# Patient Record
Sex: Male | Born: 1965 | Race: White | Hispanic: No | Marital: Married | Smoking: Never smoker
Health system: Southern US, Community
[De-identification: ages and names within clinical notes are randomized; demographics above are authoritative.]

## PROBLEM LIST (undated history)

## (undated) DIAGNOSIS — E785 Hyperlipidemia, unspecified: Secondary | ICD-10-CM

## (undated) DIAGNOSIS — I10 Essential (primary) hypertension: Secondary | ICD-10-CM

## (undated) DIAGNOSIS — K219 Gastro-esophageal reflux disease without esophagitis: Secondary | ICD-10-CM

## (undated) HISTORY — DX: Hyperlipidemia, unspecified: E78.5

## (undated) HISTORY — PX: NERVE REPAIR: SHX2083

---

## 2000-04-27 ENCOUNTER — Ambulatory Visit (HOSPITAL_BASED_OUTPATIENT_CLINIC_OR_DEPARTMENT_OTHER): Admission: RE | Admit: 2000-04-27 | Discharge: 2000-04-27 | Payer: Self-pay | Admitting: Orthopedic Surgery

## 2004-05-21 ENCOUNTER — Ambulatory Visit (HOSPITAL_COMMUNITY): Admission: RE | Admit: 2004-05-21 | Discharge: 2004-05-21 | Payer: Self-pay | Admitting: Family Medicine

## 2004-08-25 ENCOUNTER — Ambulatory Visit: Payer: Self-pay | Admitting: Orthopedic Surgery

## 2006-05-08 ENCOUNTER — Ambulatory Visit: Payer: Self-pay | Admitting: Internal Medicine

## 2006-05-08 ENCOUNTER — Encounter (INDEPENDENT_AMBULATORY_CARE_PROVIDER_SITE_OTHER): Payer: Self-pay | Admitting: *Deleted

## 2006-09-05 ENCOUNTER — Encounter: Payer: Self-pay | Admitting: Internal Medicine

## 2006-09-05 DIAGNOSIS — M545 Low back pain, unspecified: Secondary | ICD-10-CM | POA: Insufficient documentation

## 2006-09-05 DIAGNOSIS — J309 Allergic rhinitis, unspecified: Secondary | ICD-10-CM | POA: Insufficient documentation

## 2006-09-15 ENCOUNTER — Ambulatory Visit: Payer: Self-pay | Admitting: Internal Medicine

## 2007-04-20 ENCOUNTER — Ambulatory Visit (HOSPITAL_COMMUNITY): Admission: RE | Admit: 2007-04-20 | Discharge: 2007-04-20 | Payer: Self-pay | Admitting: Family Medicine

## 2011-02-18 NOTE — Op Note (Signed)
[facility]  Patient:    Martin Young, Martin Young                         MRN: [id number] Proc. Date: 04/27/00 Adm. Date:  [id number] Attending:  Ronne Binning CC:         Nicki Reaper, M.D. x 2                           Operative Report  PREOPERATIVE DIAGNOSIS:  Laceration radial nerve left arm.  POSTOPERATIVE DIAGNOSIS:  Laceration radial nerve left arm.  OPERATION:  Exploration and repair of partial laceration radial nerve left forearm.  SURGEON:  Nicki Reaper, M.D.  ASSISTANT:  Joaquin Courts, R.N.  ANESTHESIA:  Axillary block.  ANESTHESIOLOGIST:  Cliffton Asters. Ivin Booty, M.D.  HISTORY:  The patient is a 45 year old male, who suffered a laceration of his left forearm.  He complains of numbness and tingling in radial nerve distribution.  PROCEDURE:  The patient was brought to the operating room, where a an axillary block was carried out without difficulty.  He was prepped and draped using Betadine scrub and solution with the left arm free.  The limb was exsanguinated with an Esmarch bandage, tourniquet placed high on the arm was inflated to 250 mmHg.  The wound was opened, extended proximally and distally. The laceration to the sensory nerve was identified.  The dissection was carried down to the base of the laceration.  The radial nerve was found at that level with a laceration in the proper nerve as the branch came off.  The ends of the nerve were repaired back to normal fascicles.  A repair was then performed after bringing the operating microscope into position due to the size of the nerve, where repair was performed using 9-0 nylon sutures in an epineural repair.  Mild tension was present.  The nerve was free proximally and distally.  Radial deviation and dorsiflexion of the wrist helped relieve tension.  The wound was irrigated.  Skin was closed with interrupted 5-0 nylon suture.  A sterile compressive dressing and splint was applied.  The  patient tolerated the procedure well and was taken to the recovery room for observation in satisfactory condition.  He was discharged home to return to the [facility] [place] in one week on Vicodin and Keflex. DD:  04/27/00 TD:  04/28/00 Job: [id number]

## 2011-07-13 ENCOUNTER — Other Ambulatory Visit (HOSPITAL_COMMUNITY): Payer: Self-pay | Admitting: Family Medicine

## 2011-07-13 DIAGNOSIS — R404 Transient alteration of awareness: Secondary | ICD-10-CM

## 2011-07-14 ENCOUNTER — Ambulatory Visit (HOSPITAL_COMMUNITY)
Admission: RE | Admit: 2011-07-14 | Discharge: 2011-07-14 | Disposition: A | Discharge status: Home / Self Care | Payer: BC Managed Care – PPO | Source: Ambulatory Visit | Attending: Family Medicine | Admitting: Family Medicine

## 2011-07-14 DIAGNOSIS — R55 Syncope and collapse: Secondary | ICD-10-CM | POA: Insufficient documentation

## 2011-07-14 DIAGNOSIS — R42 Dizziness and giddiness: Secondary | ICD-10-CM | POA: Insufficient documentation

## 2011-07-14 DIAGNOSIS — R4182 Altered mental status, unspecified: Secondary | ICD-10-CM | POA: Insufficient documentation

## 2011-07-14 DIAGNOSIS — R404 Transient alteration of awareness: Secondary | ICD-10-CM

## 2011-07-19 ENCOUNTER — Other Ambulatory Visit (HOSPITAL_COMMUNITY): Payer: Self-pay

## 2011-07-20 ENCOUNTER — Ambulatory Visit (HOSPITAL_COMMUNITY): Payer: BC Managed Care – PPO

## 2011-07-27 ENCOUNTER — Other Ambulatory Visit (HOSPITAL_COMMUNITY): Payer: BC Managed Care – PPO

## 2011-07-27 ENCOUNTER — Ambulatory Visit (HOSPITAL_COMMUNITY)
Admission: RE | Admit: 2011-07-27 | Discharge: 2011-07-27 | Disposition: A | Discharge status: Home / Self Care | Payer: BC Managed Care – PPO | Source: Ambulatory Visit | Attending: Family Medicine | Admitting: Family Medicine

## 2011-07-27 DIAGNOSIS — Z1389 Encounter for screening for other disorder: Secondary | ICD-10-CM | POA: Insufficient documentation

## 2011-07-27 DIAGNOSIS — R404 Transient alteration of awareness: Secondary | ICD-10-CM | POA: Insufficient documentation

## 2011-07-29 NOTE — Procedures (Signed)
NAMEAIVEN, KAMPE                ACCOUNT NO.:  [id number]  MEDICAL RECORD NO.:  [id number]  LOCATION:  EE                           FACILITY:  [facility]  PHYSICIAN:  Glenice Ciccone A. Gerilyn Pilgrim, M.D. DATE OF BIRTH:  09-16-1966  DATE OF PROCEDURE:  07/27/2011 DATE OF DISCHARGE:  07/27/2011                             EEG INTERPRETATION   REFERRING PHYSICIAN:  Annia Friendly. Hill, MD.  INDICATION:  A 45 year old, who presents with episode of altered mentation and loss of consciousness.  He has been found in the yard, laying down unresponsive with amnesia.  This study is being done to evaluate for seizures.  MEDICATION: NONE  ANALYSIS:  A 16-channel recording is conducted for 20 minutes.  There is a well-formed posterior dominant rhythm of 11 Hz, which attenuates with eye opening.  There is beta activity observed in frontal areas.  Awake and drowsy activities are recorded.  Photic stimulation and hyperventilation are carried out without abnormal changes in the background activity.  There is no focal or lateralized slowing.  There is no epileptiform activity.  IMPRESSION:  Normal recording of awake and drowsy states.  A single recording does not rule out epileptic seizures.  If clinically indicated, a sleep-deprived or a prolonged recording should be useful.     Matilde Pottenger A. Gerilyn Pilgrim, M.D.     KAD/MEDQ  D:  07/28/2011  T:  07/28/2011  Job:  [id number]

## 2012-03-21 IMAGING — CT CT HEAD W/O CM
1 series · 16 of 30 positions shown, 20 images · non-contrast
Comparison: Cervical MRI 05/21/2004.

CLINICAL DATA: 45-year-old male with syncope, dizziness, altered
level of consciousness.

CT HEAD WITHOUT CONTRAST
TECHNIQUE: Contiguous axial images were obtained from the base of
the skull through the vertex without contrast.

[Series 2: headseq 4.8 h37s · axial · 0.43mm/px · z∈[+136,+291]mm · 16 of 36 slices shown, 20 images]
[im 2/36  brain]
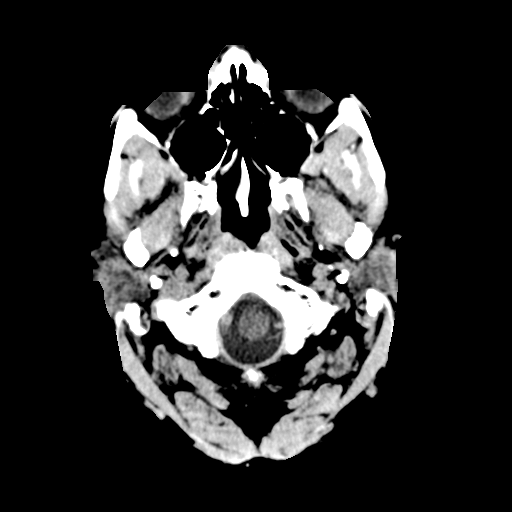
[im 2/36  bone]
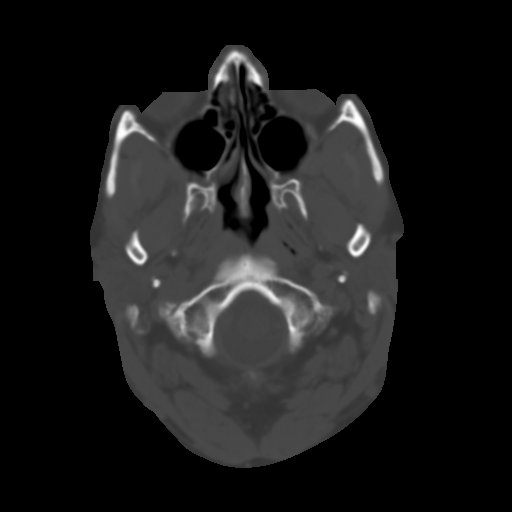
[im 4/36  brain]
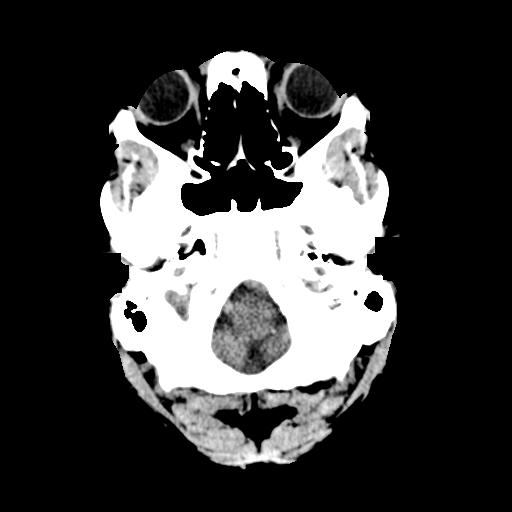
[im 7/36  brain]
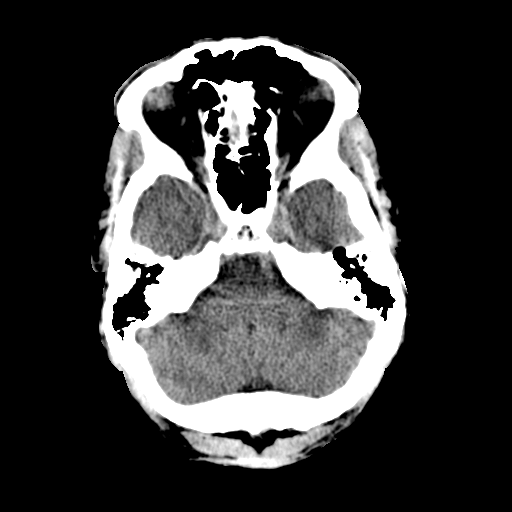
[im 9/36  brain]
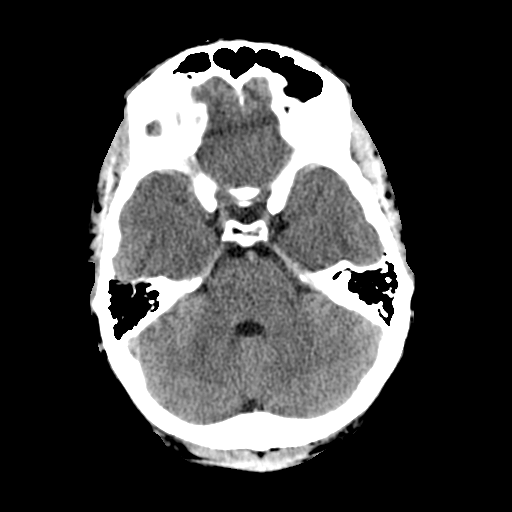
[im 10/36  brain]
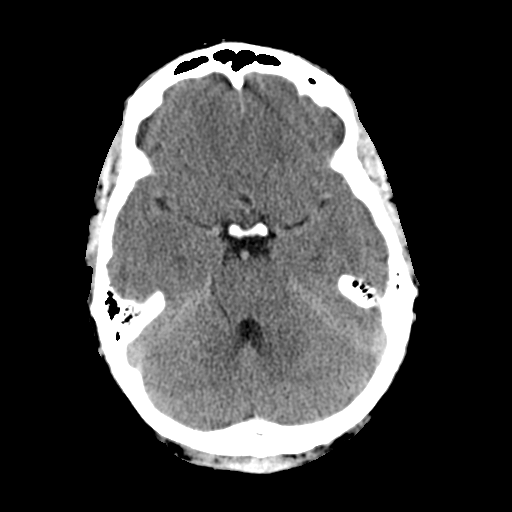
[im 10/36  bone]
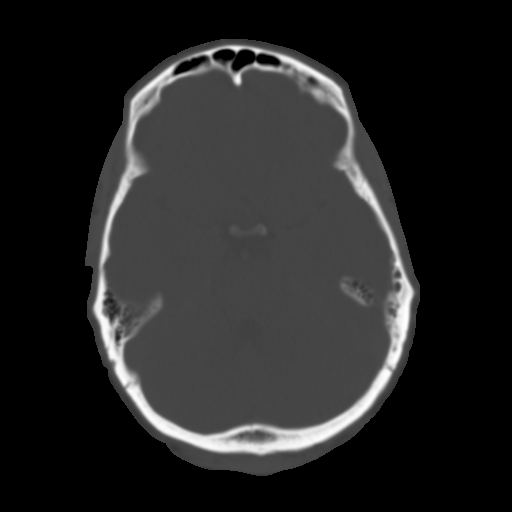
[im 13/36  brain]
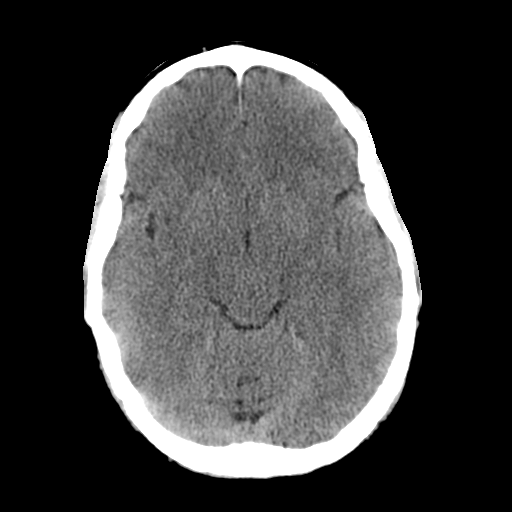
[im 15/36  brain]
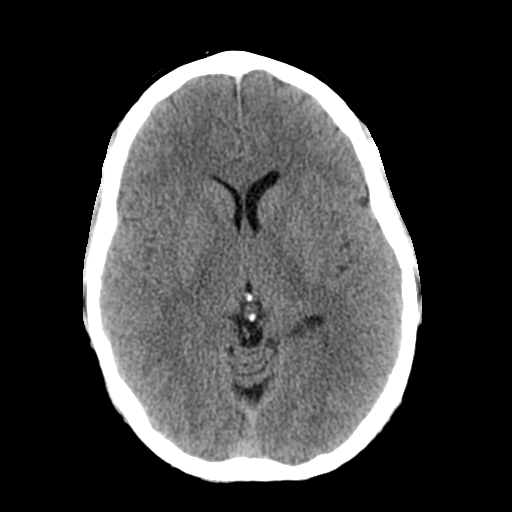
[im 17/36  brain]
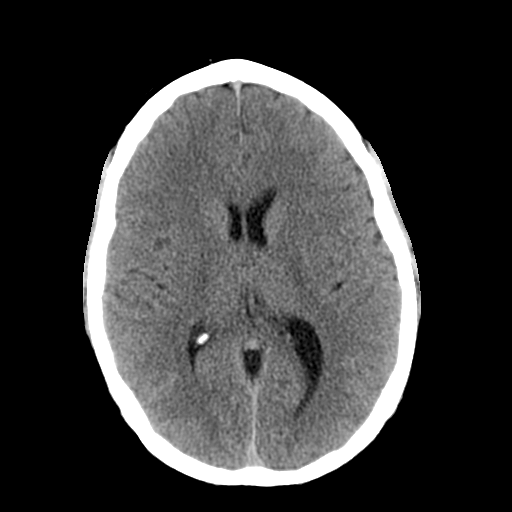
[im 19/36  brain]
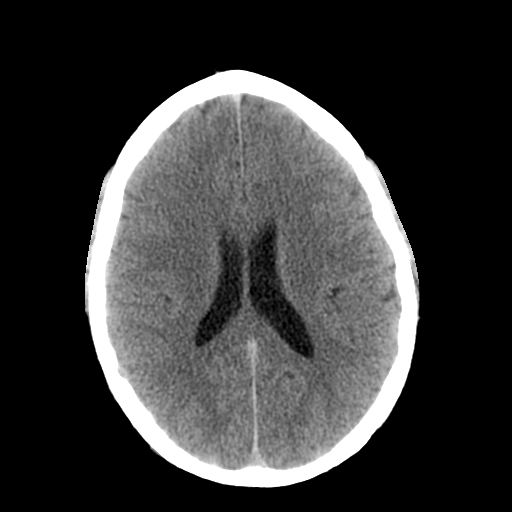
[im 19/36  bone]
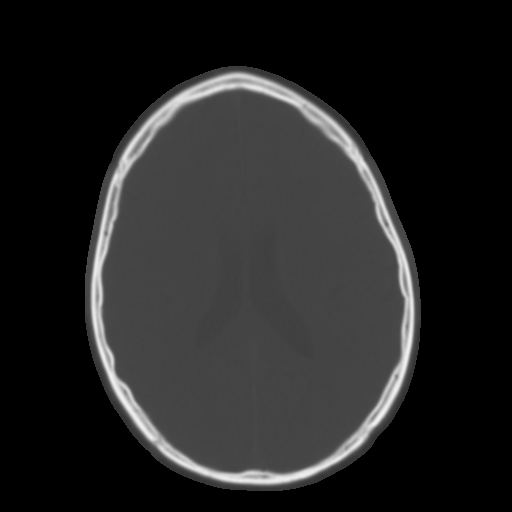
[im 21/36  brain]
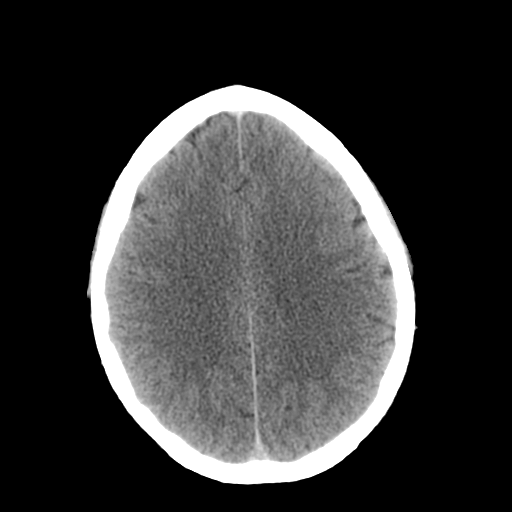
[im 23/36  brain]
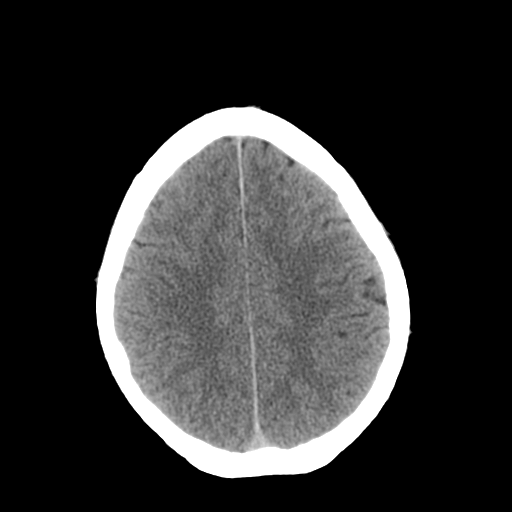
[im 26/36  brain]
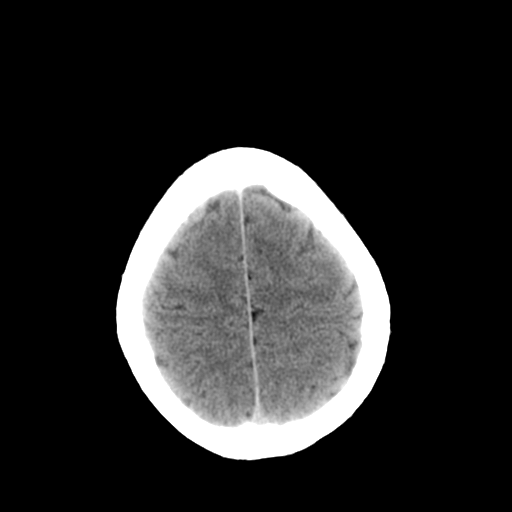
[im 27/36  brain]
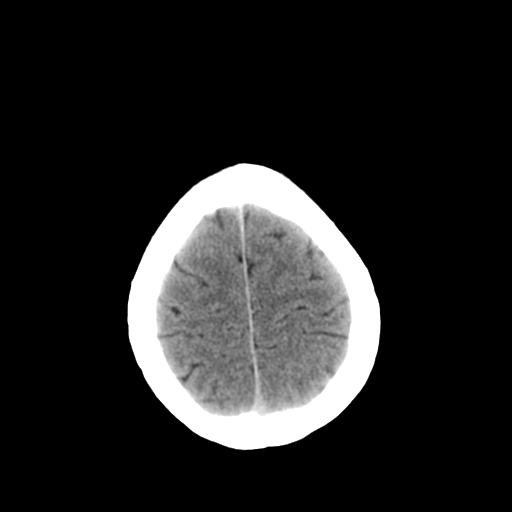
[im 27/36  bone]
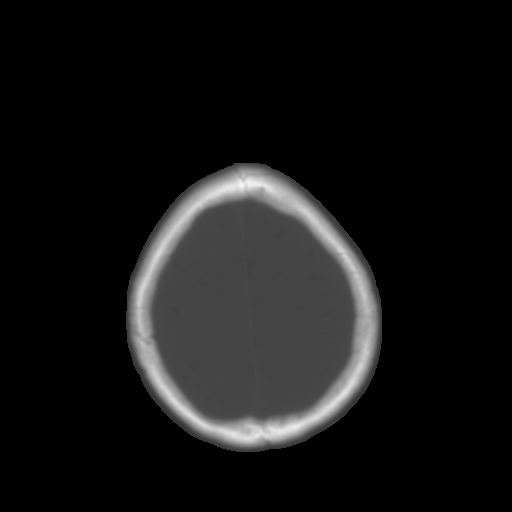
[im 29/36  brain]
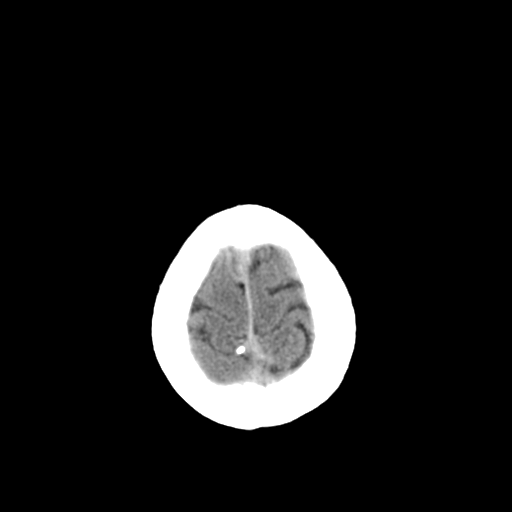
[im 32/36  brain]
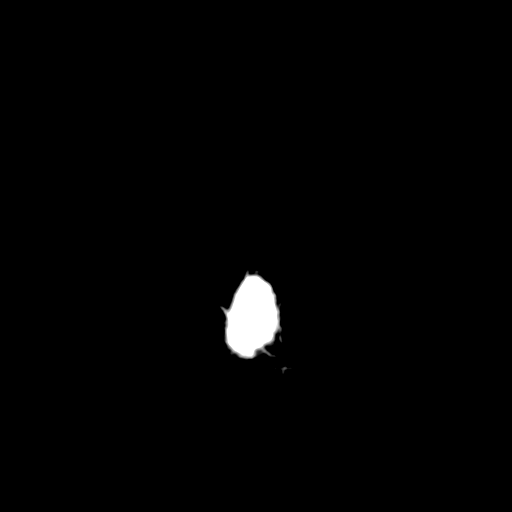
[im 34/36  brain]
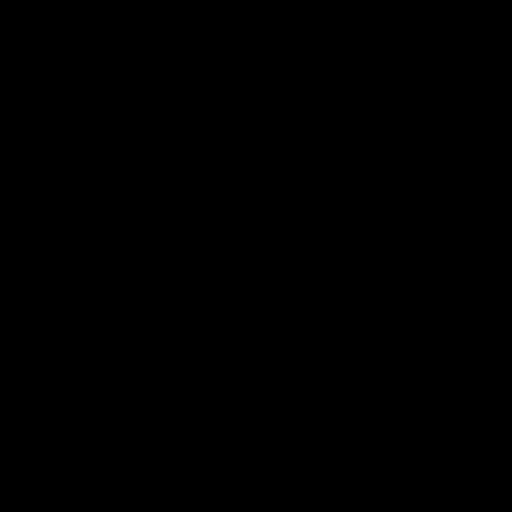

[16 of 30 positions shown; findings below may reference images not displayed]

FINDINGS: Visualized orbits and scalp soft tissues are within
normal limits.  Visualized paranasal sinuses and mastoids are
clear.  No acute osseous abnormality identified.

Cerebral volume is within normal limits for age.  No midline shift,
ventriculomegaly, mass effect, evidence of mass lesion,
intracranial hemorrhage or evidence of cortically based acute
infarction.  Gray-white matter differentiation is within normal
limits throughout the brain.  No suspicious intracranial vascular
hyperdensity.
IMPRESSION: Normal noncontrast CT appearance of the brain.

## 2013-12-17 ENCOUNTER — Other Ambulatory Visit (HOSPITAL_COMMUNITY): Payer: Self-pay | Admitting: Family Medicine

## 2013-12-17 ENCOUNTER — Ambulatory Visit (HOSPITAL_COMMUNITY)
Admission: RE | Admit: 2013-12-17 | Discharge: 2013-12-17 | Disposition: A | Discharge status: Home / Self Care | Payer: Managed Care, Other (non HMO) | Source: Ambulatory Visit | Attending: Family Medicine | Admitting: Family Medicine

## 2013-12-17 DIAGNOSIS — M79609 Pain in unspecified limb: Secondary | ICD-10-CM | POA: Insufficient documentation

## 2013-12-17 DIAGNOSIS — W2209XA Striking against other stationary object, initial encounter: Secondary | ICD-10-CM | POA: Insufficient documentation

## 2013-12-17 DIAGNOSIS — M79642 Pain in left hand: Secondary | ICD-10-CM

## 2015-06-30 ENCOUNTER — Ambulatory Visit (INDEPENDENT_AMBULATORY_CARE_PROVIDER_SITE_OTHER): Payer: Managed Care, Other (non HMO) | Admitting: Orthopedic Surgery

## 2015-06-30 ENCOUNTER — Ambulatory Visit (INDEPENDENT_AMBULATORY_CARE_PROVIDER_SITE_OTHER): Payer: Managed Care, Other (non HMO)

## 2015-06-30 VITALS — BP 118/77 | Ht 72.0 in | Wt 200.0 lb

## 2015-06-30 DIAGNOSIS — M7021 Olecranon bursitis, right elbow: Secondary | ICD-10-CM | POA: Diagnosis not present

## 2015-06-30 DIAGNOSIS — M25521 Pain in right elbow: Secondary | ICD-10-CM

## 2015-06-30 NOTE — Progress Notes (Signed)
Patient ID: Martin Young, male   DOB: [birth year], 49 y.o.   MRN: [id number]  New patient   Chief Complaint  Patient presents with  . Elbow Pain    Right elbow pain, injury back in July.     [name] is a 49 y.o. male.   HPI 49 year old male hit his elbow in July. Since that time he said pain swelling over the right olecranon bursa. Pain is dull it's 1 out of 10 does not affect range of motion. No neurovascular deficits no redness to the area he does have some seasonal allergies Review of Systems See hpi  Hypertension medical history on file.  No past surgical history .    Social History Social History  Substance Use Topics  . Smoking status: Not on file  . Smokeless tobacco: Not on file  . Alcohol Use: Not on file    No Known Allergies  No current outpatient prescriptions on file.   No current facility-administered medications for this visit.       Physical Exam Blood pressure 118/77, height 6' (1.829 m), weight 200 lb (90.719 kg). Physical Exam The patient is well developed well nourished and well groomed. Orientation to person place and time is normal  Mood is pleasant. Ambulatory status is normal without a limp Skin remains intact without laceration ulceration or erythema Gross motor exam is intact without atrophy. Muscle tone normal grade 5 motor strength Neurovascular exam remains intact Inspection right elbow large swelling no erythema or range of motion of the joint which is stable. Looks and feels like olecranon bursa.    Data Reviewed  I interpreted the images as  Soft tissue swelling and olecranon spur consistent with olecranon bursitis  Assessment   Encounter Diagnosis  Name Primary?  . Elbow pain, right Yes  Right olecranon bursitis  Plan   Aspiration right elbow  Aspiration right elbow. Verbal consent. Timeout confirm site. Alcohol. Ethyl chloride. 18-gauge needle 10 mL syringe aspirated 15 mL of bloody fluid consistent with  posttraumatic olecranon bursitis  Ace wrap no complications  Follow-up as needed

## 2015-07-02 ENCOUNTER — Telehealth: Payer: Self-pay | Admitting: Orthopedic Surgery

## 2015-07-02 NOTE — Telephone Encounter (Signed)
Call received from patient regarding right elbow problem (olecranon bursitis) which was treated 06/30/15  - said that the problem is recurring, and is "about the same as it was then" - swelling, pain again.  Asking if needs another appointment or if other recommendation:  Please advise.

## 2015-07-02 NOTE — Telephone Encounter (Signed)
Re aspiration Monday afternoon 130

## 2015-07-06 ENCOUNTER — Encounter: Payer: Self-pay | Admitting: Orthopedic Surgery

## 2015-07-06 ENCOUNTER — Ambulatory Visit (INDEPENDENT_AMBULATORY_CARE_PROVIDER_SITE_OTHER): Payer: Managed Care, Other (non HMO) | Admitting: Orthopedic Surgery

## 2015-07-06 VITALS — BP 130/83 | Ht 72.0 in | Wt 200.0 lb

## 2015-07-06 DIAGNOSIS — M7021 Olecranon bursitis, right elbow: Secondary | ICD-10-CM

## 2015-07-06 NOTE — Progress Notes (Signed)
Patient ID: Martin Young, male   DOB: [birth year], 49 y.o.   MRN: [id number]  Follow up visit  Chief Complaint  Patient presents with  . Follow-up    FOLLOW UP RIGHT ELBOW    BP 130/83 mmHg  Ht 6' (1.829 m)  Wt 200 lb (90.719 kg)  BMI 27.12 kg/m2  No diagnosis found.   status post aspiration and drainage for olecranon bursitis. The fluid came back almost immediately. We therefore reaspirated today and I recommend that he have the bursa removed. He also has a spur on the olecranon which I would like to take off as well. We will arrange this at his convenience.  Review of Systems  Endo/Heme/Allergies: Positive for environmental allergies.  All other systems reviewed and are negative.  No past medical history  No past surgical history   social history no smoking or drinking   Vital signs are stable  Appearance is normal. He is oriented 3. Mood is pleasant. Gait is unremarkable. His right elbow was swollen and tender there is a slight redness to the skin without erythema. Range of motion is not affected stability is normal muscle tone and strength are normal skin is intact sensation is normal vascular exam is normal and lymph nodes are negative  Encounter Diagnosis  Name Primary?  Marland Kitchen Olecranon bursitis of right elbow Yes    Plan is for excisional right olecranon bursa Aspiration right elbow    verbal consent was obtained timeout was used to identify the site and confirmed. We then used ethyl chloride and alcohol to prep the skin with then placed an 18-gauge needle in the bursal sac and removed approximately 18 mL of bloody

## 2015-07-06 NOTE — Addendum Note (Signed)
Addended by: Baldomero Lamy B on: 07/06/2015 03:40 PM   Modules accepted: Orders

## 2015-07-06 NOTE — Telephone Encounter (Signed)
07/03/15 Spoke with patient and scheduled accordingly.

## 2015-07-06 NOTE — Patient Instructions (Signed)
Note for light duty until surgery.  Schedule surgery on a Friday for Right elbow, olecranon bursectomy

## 2015-07-08 ENCOUNTER — Telehealth: Payer: Self-pay | Admitting: Orthopedic Surgery

## 2015-07-08 NOTE — Telephone Encounter (Signed)
CALLED PATIENT, NO ANSWER, LEFT DETAILED VM

## 2015-07-08 NOTE — Telephone Encounter (Signed)
York Cerise please call Donnald he has questions regarding when his Elbow Procedure will be scheduled?

## 2015-07-09 ENCOUNTER — Telehealth: Payer: Self-pay | Admitting: Orthopedic Surgery

## 2015-07-09 ENCOUNTER — Ambulatory Visit (INDEPENDENT_AMBULATORY_CARE_PROVIDER_SITE_OTHER): Payer: Managed Care, Other (non HMO) | Admitting: Orthopedic Surgery

## 2015-07-09 ENCOUNTER — Encounter: Payer: Self-pay | Admitting: Orthopedic Surgery

## 2015-07-09 VITALS — BP 120/89 | Ht 72.0 in | Wt 200.0 lb

## 2015-07-09 DIAGNOSIS — M7021 Olecranon bursitis, right elbow: Secondary | ICD-10-CM

## 2015-07-09 NOTE — Patient Instructions (Signed)
You have been scheduled for surgery next Friday 07/17/15

## 2015-07-09 NOTE — Telephone Encounter (Signed)
Patient is scheduled to come in this afternoon for elbow drainage

## 2015-07-09 NOTE — Progress Notes (Addendum)
Planned aspiration right  Repeat aspiration right elbow  Verbal consent was obtained timeout was used to identify the site is right  elbow this was confirmed. We then used ethyl chloride and alcohol to prep the skin and then placed an 18-gauge needle in the bursal sac and removed it with about 12 mL of bloody fluid. Patient will have surgery in the coming weeks

## 2015-07-09 NOTE — Telephone Encounter (Signed)
Regarding out-patient surgery scheduled 07/17/15 at [facility], [place], ICD-10 M70.21, M25.521, called insurer, Christella Scheuermann, ph# [phone]; per automated voice response system, no pre-authorization is required for out-patient procedures; may be subject to review after claim submission.  No reference # provided - date and time for reference: 07/09/15, 11:47a.m.

## 2015-07-10 NOTE — Patient Instructions (Signed)
    Martin Young  07/10/2015     @PREFPERIOPPHARMACY @   Your procedure is scheduled on 07/17/2015  Report to [name] at 6:15 A.M.  Call this number if you have problems the morning of surgery:  [phone]   Remember:  Do not eat food or drink liquids after midnight.  Take these medicines the morning of surgery with A SIP OF WATER NA   Do not wear jewelry, make-up or nail polish.  Do not wear lotions, powders, or perfumes.  You may wear deodorant.  Do not shave 48 hours prior to surgery.  Men may shave face and neck.  Do not bring valuables to the hospital.  [facility] is not responsible for any belongings or valuables.  Contacts, dentures or bridgework may not be worn into surgery.  Leave your suitcase in the car.  After surgery it may be brought to your room.  For patients admitted to the hospital, discharge time will be determined by your treatment team.  Patients discharged the day of surgery will not be allowed to drive home.   Please read over the following fact sheets that you were given. Surgical Site Infection Prevention and Anesthesia Post-op Instructions     PATIENT INSTRUCTIONS POST-ANESTHESIA  IMMEDIATELY FOLLOWING SURGERY:  Do not drive or operate machinery for the first twenty four hours after surgery.  Do not make any important decisions for twenty four hours after surgery or while taking narcotic pain medications or sedatives.  If you develop intractable nausea and vomiting or a severe headache please notify your doctor immediately.  FOLLOW-UP:  Please make an appointment with your surgeon as instructed. You do not need to follow up with anesthesia unless specifically instructed to do so.  WOUND CARE INSTRUCTIONS (if applicable):  Keep a dry clean dressing on the anesthesia/puncture wound site if there is drainage.  Once the wound has quit draining you may leave it open to air.  Generally you should leave the bandage intact for twenty four hours unless  there is drainage.  If the epidural site drains for more than 36-48 hours please call the anesthesia department.  QUESTIONS?:  Please feel free to call your physician or the hospital operator if you have any questions, and they will be happy to assist you.

## 2015-07-13 ENCOUNTER — Ambulatory Visit: Payer: Managed Care, Other (non HMO) | Admitting: Orthopedic Surgery

## 2015-07-14 ENCOUNTER — Encounter (HOSPITAL_COMMUNITY)
Admission: RE | Admit: 2015-07-14 | Discharge: 2015-07-14 | Disposition: A | Discharge status: Home / Self Care | Payer: Managed Care, Other (non HMO) | Source: Ambulatory Visit | Attending: Orthopedic Surgery | Admitting: Orthopedic Surgery

## 2015-07-14 ENCOUNTER — Other Ambulatory Visit: Payer: Self-pay | Admitting: *Deleted

## 2015-07-14 ENCOUNTER — Encounter (HOSPITAL_COMMUNITY): Payer: Self-pay

## 2015-07-14 DIAGNOSIS — M7021 Olecranon bursitis, right elbow: Secondary | ICD-10-CM | POA: Diagnosis present

## 2015-07-14 DIAGNOSIS — K219 Gastro-esophageal reflux disease without esophagitis: Secondary | ICD-10-CM | POA: Diagnosis not present

## 2015-07-14 DIAGNOSIS — I10 Essential (primary) hypertension: Secondary | ICD-10-CM | POA: Diagnosis not present

## 2015-07-14 DIAGNOSIS — Z79899 Other long term (current) drug therapy: Secondary | ICD-10-CM | POA: Diagnosis not present

## 2015-07-14 DIAGNOSIS — Z7982 Long term (current) use of aspirin: Secondary | ICD-10-CM | POA: Diagnosis not present

## 2015-07-14 DIAGNOSIS — Z01810 Encounter for preprocedural cardiovascular examination: Secondary | ICD-10-CM | POA: Diagnosis not present

## 2015-07-14 DIAGNOSIS — Z01812 Encounter for preprocedural laboratory examination: Secondary | ICD-10-CM | POA: Diagnosis not present

## 2015-07-14 HISTORY — DX: Gastro-esophageal reflux disease without esophagitis: K21.9

## 2015-07-14 HISTORY — DX: Essential (primary) hypertension: I10

## 2015-07-14 LAB — BASIC METABOLIC PANEL
ANION GAP: 5 (ref 5–15)
BUN: 19 mg/dL (ref 6–20)
CO2: 28 mmol/L (ref 22–32)
Calcium: 8.6 mg/dL — ABNORMAL LOW (ref 8.9–10.3)
Chloride: 104 mmol/L (ref 101–111)
Creatinine, Ser: 1.16 mg/dL (ref 0.61–1.24)
GFR calc Af Amer: >60 mL/min (ref >60)
GFR calc non Af Amer: >60 mL/min (ref >60)
GLUCOSE: 117 mg/dL — AB (ref 65–99)
POTASSIUM: 3.8 mmol/L (ref 3.5–5.1)
Sodium: 137 mmol/L (ref 135–145)

## 2015-07-14 LAB — CBC WITH DIFFERENTIAL/PLATELET
BASOS ABS: 0 10*3/uL (ref 0.0–0.1)
Basophils Relative: 0 %
EOS PCT: 2 %
Eosinophils Absolute: 0.1 10*3/uL (ref 0.0–0.7)
HEMATOCRIT: 36.9 % — AB (ref 39.0–52.0)
Hemoglobin: 12.1 g/dL — ABNORMAL LOW (ref 13.0–17.0)
LYMPHS ABS: 1.5 10*3/uL (ref 0.7–4.0)
LYMPHS PCT: 30 %
MCH: 27.6 pg (ref 26.0–34.0)
MCHC: 32.8 g/dL (ref 30.0–36.0)
MCV: 84.1 fL (ref 78.0–100.0)
MONO ABS: 0.3 10*3/uL (ref 0.1–1.0)
Monocytes Relative: 6 %
NEUTROS ABS: 3 10*3/uL (ref 1.7–7.7)
Neutrophils Relative %: 62 %
Platelets: 246 10*3/uL (ref 150–400)
RBC: 4.39 MIL/uL (ref 4.22–5.81)
RDW: 14.6 % (ref 11.5–15.5)
WBC: 4.9 10*3/uL (ref 4.0–10.5)

## 2015-07-16 NOTE — H&P (Signed)
Chief Complaint   Patient presents with   .  Elbow Pain       Right elbow pain, injury back in July.      Martin Young is a 49 y.o. male.   HPI 49 year old male hit his elbow in July. Since that time he said pain swelling over the right olecranon bursa. Pain is dull it's 1 out of 10 does not affect range of motion. No neurovascular deficits no redness to the area he does have some seasonal allergies.  We aspirated twice fluid came back and now presents for surgical removal of the bursa Review of Systems See hpi  Hypertension medical history on file.  No past surgical history .    Social History Social History   Substance Use Topics   .  Smoking status:  Not on file   .  Smokeless tobacco:  Not on file   .  Alcohol Use:  Not on file     No Known Allergies    No current outpatient prescriptions on file.      No current facility-administered medications for this visit.        Physical Exam Blood pressure 118/77, height 6' (1.829 m), weight 200 lb (90.719 kg). Physical Exam The patient is well developed well nourished and well groomed. Orientation to person place and time is normal   Mood is pleasant. Ambulatory status is normal without a limp Skin remains intact without laceration ulceration or erythema Gross motor exam is intact without atrophy. Muscle tone normal grade 5 motor strength Neurovascular exam remains intact Inspection right elbow large swelling no erythema or range of motion of the joint which is stable. Looks and feels like olecranon bursa.    Data Reviewed  I interpreted the images as  Soft tissue swelling and olecranon spur consistent with olecranon bursitis  Assessment     Encounter Diagnosis   Name  Primary?   .  Elbow pain, right  Yes   Right olecranon bursitis  Plan  Removal of right olecranon bursa/right olecranon bursectomy

## 2015-07-17 ENCOUNTER — Ambulatory Visit (HOSPITAL_COMMUNITY)
Admission: RE | Admit: 2015-07-17 | Discharge: 2015-07-17 | Disposition: A | Discharge status: Home / Self Care | Payer: Managed Care, Other (non HMO) | Source: Ambulatory Visit | Attending: Orthopedic Surgery | Admitting: Orthopedic Surgery

## 2015-07-17 ENCOUNTER — Encounter (HOSPITAL_COMMUNITY)
Admission: RE | Disposition: A | Discharge status: Home / Self Care | Payer: Self-pay | Source: Ambulatory Visit | Attending: Orthopedic Surgery

## 2015-07-17 ENCOUNTER — Ambulatory Visit (HOSPITAL_COMMUNITY): Payer: Managed Care, Other (non HMO) | Admitting: Anesthesiology

## 2015-07-17 ENCOUNTER — Encounter (HOSPITAL_COMMUNITY): Payer: Self-pay | Admitting: *Deleted

## 2015-07-17 DIAGNOSIS — Z01812 Encounter for preprocedural laboratory examination: Secondary | ICD-10-CM | POA: Insufficient documentation

## 2015-07-17 DIAGNOSIS — Z7982 Long term (current) use of aspirin: Secondary | ICD-10-CM | POA: Insufficient documentation

## 2015-07-17 DIAGNOSIS — Z79899 Other long term (current) drug therapy: Secondary | ICD-10-CM | POA: Insufficient documentation

## 2015-07-17 DIAGNOSIS — Z01810 Encounter for preprocedural cardiovascular examination: Secondary | ICD-10-CM | POA: Insufficient documentation

## 2015-07-17 DIAGNOSIS — K219 Gastro-esophageal reflux disease without esophagitis: Secondary | ICD-10-CM | POA: Insufficient documentation

## 2015-07-17 DIAGNOSIS — I10 Essential (primary) hypertension: Secondary | ICD-10-CM | POA: Insufficient documentation

## 2015-07-17 DIAGNOSIS — M7021 Olecranon bursitis, right elbow: Secondary | ICD-10-CM | POA: Insufficient documentation

## 2015-07-17 HISTORY — PX: OLECRANON BURSECTOMY: SHX2097

## 2015-07-17 SURGERY — BURSECTOMY, ELBOW
Anesthesia: General | Site: Elbow | Laterality: Right

## 2015-07-17 MED ORDER — FENTANYL CITRATE (PF) 100 MCG/2ML IJ SOLN
INTRAMUSCULAR | Status: AC
  Filled 2015-07-17: qty 4

## 2015-07-17 MED ORDER — BUPIVACAINE-EPINEPHRINE 0.5% -1:200000 IJ SOLN
INTRAMUSCULAR | Status: DC | PRN
  Administered 2015-07-17: 30 mL

## 2015-07-17 MED ORDER — LACTATED RINGERS IV SOLN
INTRAVENOUS | Status: DC
  Administered 2015-07-17: 07:00:00 via INTRAVENOUS

## 2015-07-17 MED ORDER — LIDOCAINE HCL (PF) 1 % IJ SOLN
INTRAMUSCULAR | Status: AC
  Filled 2015-07-17: qty 5

## 2015-07-17 MED ORDER — PROPOFOL 10 MG/ML IV BOLUS
INTRAVENOUS | Status: DC | PRN
  Administered 2015-07-17: 150 mg via INTRAVENOUS
  Administered 2015-07-17: 30 mg via INTRAVENOUS
  Administered 2015-07-17: 70 mg via INTRAVENOUS
  Administered 2015-07-17: 50 mg via INTRAVENOUS

## 2015-07-17 MED ORDER — FENTANYL CITRATE (PF) 100 MCG/2ML IJ SOLN
INTRAMUSCULAR | Status: AC
  Filled 2015-07-17: qty 2

## 2015-07-17 MED ORDER — BUPIVACAINE-EPINEPHRINE (PF) 0.5% -1:200000 IJ SOLN
INTRAMUSCULAR | Status: AC
  Filled 2015-07-17: qty 60

## 2015-07-17 MED ORDER — FENTANYL CITRATE (PF) 100 MCG/2ML IJ SOLN
25.0000 ug | INTRAMUSCULAR | Status: AC
  Administered 2015-07-17: 25 ug via INTRAVENOUS
  Filled 2015-07-17: qty 2

## 2015-07-17 MED ORDER — FENTANYL CITRATE (PF) 100 MCG/2ML IJ SOLN
INTRAMUSCULAR | Status: DC | PRN
  Administered 2015-07-17 (×4): 25 ug via INTRAVENOUS

## 2015-07-17 MED ORDER — ONDANSETRON HCL 4 MG/2ML IJ SOLN: 4.0000 mg | Freq: Once | INTRAMUSCULAR | Status: DC | PRN

## 2015-07-17 MED ORDER — MIDAZOLAM HCL 2 MG/2ML IJ SOLN
1.0000 mg | INTRAMUSCULAR | Status: DC | PRN
  Administered 2015-07-17: 2 mg via INTRAVENOUS

## 2015-07-17 MED ORDER — HYDROCODONE-ACETAMINOPHEN 5-325 MG PO TABS: 1.0000 | ORAL_TABLET | Freq: Four times a day (QID) | ORAL | Status: DC | PRN

## 2015-07-17 MED ORDER — PROPOFOL 10 MG/ML IV BOLUS
INTRAVENOUS | Status: AC
  Filled 2015-07-17: qty 20

## 2015-07-17 MED ORDER — MIDAZOLAM HCL 2 MG/2ML IJ SOLN
INTRAMUSCULAR | Status: AC
  Filled 2015-07-17: qty 2

## 2015-07-17 MED ORDER — ONDANSETRON HCL 4 MG/2ML IJ SOLN
INTRAMUSCULAR | Status: AC
  Filled 2015-07-17: qty 2

## 2015-07-17 MED ORDER — ONDANSETRON HCL 4 MG/2ML IJ SOLN
4.0000 mg | Freq: Once | INTRAMUSCULAR | Status: AC
  Administered 2015-07-17: 4 mg via INTRAVENOUS

## 2015-07-17 MED ORDER — FENTANYL CITRATE (PF) 100 MCG/2ML IJ SOLN
25.0000 ug | INTRAMUSCULAR | Status: DC | PRN
  Administered 2015-07-17: 50 ug via INTRAVENOUS

## 2015-07-17 MED ORDER — SODIUM CHLORIDE 0.9 % IR SOLN
Status: DC | PRN
  Administered 2015-07-17: 1000 mL

## 2015-07-17 MED ORDER — CEFAZOLIN SODIUM-DEXTROSE 2-3 GM-% IV SOLR
2.0000 g | INTRAVENOUS | Status: AC
  Administered 2015-07-17: 2 g via INTRAVENOUS
  Filled 2015-07-17: qty 50

## 2015-07-17 MED ORDER — LIDOCAINE HCL (CARDIAC) 10 MG/ML IV SOLN
INTRAVENOUS | Status: DC | PRN
  Administered 2015-07-17: 50 mg via INTRAVENOUS

## 2015-07-17 SURGICAL SUPPLY — 56 items
BAG HAMPER (MISCELLANEOUS) ×3 IMPLANT
BANDAGE ELASTIC 4 VELCRO NS (GAUZE/BANDAGES/DRESSINGS) ×2 IMPLANT
BANDAGE ELASTIC 6 VELCRO NS (GAUZE/BANDAGES/DRESSINGS) IMPLANT
BANDAGE ESMARK 4X12 BL STRL LF (DISPOSABLE) ×1 IMPLANT
BIT DRILL 2.0MX128MM (BIT) ×1 IMPLANT
BLADE SURG SZ10 CARB STEEL (BLADE) ×2 IMPLANT
BNDG CMPR 12X4 ELC STRL LF (DISPOSABLE) ×1
BNDG COHESIVE 4X5 TAN STRL (GAUZE/BANDAGES/DRESSINGS) IMPLANT
BNDG ESMARK 4X12 BLUE STRL LF (DISPOSABLE) ×3
CHLORAPREP W/TINT 26ML (MISCELLANEOUS) ×3 IMPLANT
CLOTH BEACON ORANGE TIMEOUT ST (SAFETY) ×3 IMPLANT
COVER LIGHT HANDLE STERIS (MISCELLANEOUS) ×6 IMPLANT
CUFF TOURNIQUET SINGLE 18IN (TOURNIQUET CUFF) ×2 IMPLANT
DECANTER SPIKE VIAL GLASS SM (MISCELLANEOUS) ×3 IMPLANT
ELECT REM PT RETURN 9FT ADLT (ELECTROSURGICAL) ×3
ELECTRODE REM PT RTRN 9FT ADLT (ELECTROSURGICAL) ×1 IMPLANT
FORMALIN 10 PREFIL 120ML (MISCELLANEOUS) ×2 IMPLANT
GAUZE KERLIX 2X3 DERM STRL LF (GAUZE/BANDAGES/DRESSINGS) ×3 IMPLANT
GAUZE XEROFORM 5X9 LF (GAUZE/BANDAGES/DRESSINGS) ×2 IMPLANT
GLOVE BIOGEL M 6.5 STRL (GLOVE) ×2 IMPLANT
GLOVE ECLIPSE 6.5 STRL STRAW (GLOVE) ×2 IMPLANT
GLOVE INDICATOR 7.0 STRL GRN (GLOVE) ×4 IMPLANT
GLOVE SKINSENSE NS SZ8.0 LF (GLOVE) ×2
GLOVE SKINSENSE STRL SZ8.0 LF (GLOVE) ×1 IMPLANT
GLOVE SS N UNI LF 8.5 STRL (GLOVE) ×3 IMPLANT
GOWN STRL REUS W/TWL LRG LVL3 (GOWN DISPOSABLE) ×9 IMPLANT
GOWN STRL REUS W/TWL XL LVL3 (GOWN DISPOSABLE) ×3 IMPLANT
INST SET MINOR BONE (KITS) ×3 IMPLANT
KIT ROOM TURNOVER APOR (KITS) ×3 IMPLANT
MANIFOLD NEPTUNE II (INSTRUMENTS) ×3 IMPLANT
NDL HYPO 21X1.5 SAFETY (NEEDLE) ×1 IMPLANT
NEEDLE HYPO 21X1.5 SAFETY (NEEDLE) ×3 IMPLANT
NS IRRIG 1000ML POUR BTL (IV SOLUTION) ×3 IMPLANT
PACK BASIC LIMB (CUSTOM PROCEDURE TRAY) ×3 IMPLANT
PAD ABD 5X9 TENDERSORB (GAUZE/BANDAGES/DRESSINGS) IMPLANT
PAD ARMBOARD 7.5X6 YLW CONV (MISCELLANEOUS) ×3 IMPLANT
PAD CAST 4YDX4 CTTN HI CHSV (CAST SUPPLIES) ×1 IMPLANT
PADDING CAST COTTON 4X4 STRL (CAST SUPPLIES) ×6
SET BASIN LINEN APH (SET/KITS/TRAYS/PACK) ×3 IMPLANT
SLING ARM FOAM STRAP MED (SOFTGOODS) ×2 IMPLANT
SLING ARM FOAM STRAP XLG (SOFTGOODS) IMPLANT
SLING ARM LRG ADULT FOAM STRAP (SOFTGOODS) IMPLANT
SLING ARM MED ADULT FOAM STRAP (SOFTGOODS) IMPLANT
SPONGE GAUZE 4X4 12PLY (GAUZE/BANDAGES/DRESSINGS) ×2 IMPLANT
SPONGE LAP 18X18 X RAY DECT (DISPOSABLE) ×3 IMPLANT
STAPLER VISISTAT 35W (STAPLE) ×2 IMPLANT
SUT ETHILON 3 0 FSL (SUTURE) ×1 IMPLANT
SUT MON AB 0 CT1 (SUTURE) ×2 IMPLANT
SUT MON AB 2-0 CT1 36 (SUTURE) ×2 IMPLANT
SUT MON AB 2-0 SH 27 (SUTURE) ×6
SUT MON AB 2-0 SH27 (SUTURE) ×1 IMPLANT
SUT VIC AB 1 CT1 27 (SUTURE) ×3
SUT VIC AB 1 CT1 27XBRD ANTBC (SUTURE) IMPLANT
SYR 30ML LL (SYRINGE) ×2 IMPLANT
SYR BULB IRRIGATION 50ML (SYRINGE) ×3 IMPLANT
SYRINGE 10CC LL (SYRINGE) ×3 IMPLANT

## 2015-07-17 NOTE — Op Note (Signed)
Operative report  October 14 th,  2016  Preoperative diagnosis olecranon bursitis right elbow  Postop diagnosis same  Procedure excision of olecranon bursa right elbow and bone spur  Operative findings large olecranon bursal sac plus olecranon bone spur  Surgeon Aline Brochure Assisted by Simonne Maffucci Gen. Anesthesia  Details of procedure  The patient was identified in the preop area and confirm the surgical site and marked. We reviewed the chart and updated. The patient was then taken to the operating room. 2 g of Ancef were started IV. Anesthesia was given and completed without complication  Timeout was executed  Limited exsanguination 4 inch Esmarch tourniquet elevated 250 mmHg  A midline incision slightly curved around the olecranon was performed subcutaneous tissue was divided down to the bursal sac the bursal sac was removed as one piece the triceps tendon was split and the bursa was removed with a rongeur and a rasp until smooth the wound was irrigated and closed with 2-0 Monocryl in interrupted fashion skin reapproximated with staples. Skin injected with 30 mL of Marcaine with epinephrine  Sterile dressing applied tourniquet released  Arm placed in sling  Patient extubated taken recovery stable condition

## 2015-07-17 NOTE — Interval H&P Note (Signed)
History and Physical Interval Note:  07/17/2015 7:21 AM  Martin Young  has presented today for surgery, with the diagnosis of right olecranon bursa  The various methods of treatment have been discussed with the patient and family. After consideration of risks, benefits and other options for treatment, the patient has consented to  Procedure(s): OLECRANON BURSA (Right) as a surgical intervention .  The patient's history has been reviewed, patient examined, no change in status, stable for surgery.  I have reviewed the patient's chart and labs.  Questions were answered to the patient's satisfaction.     Arther Abbott

## 2015-07-17 NOTE — Transfer of Care (Signed)
Immediate Anesthesia Transfer of Care Note  Patient: Martin Young  Procedure(s) Performed: Procedure(s) (LRB): OLECRANON BURSECTOMY (Right)  Patient Location: PACU  Anesthesia Type: General  Level of Consciousness: awake  Airway & Oxygen Therapy: Patient Spontanous Breathing and non-rebreather face mask  Post-op Assessment: Report given to PACU RN, Post -op Vital signs reviewed and stable and Patient moving all extremities  Post vital signs: Reviewed and stable  Complications: No apparent anesthesia complications

## 2015-07-17 NOTE — Anesthesia Procedure Notes (Signed)
Procedure Name: LMA Insertion Date/Time: 07/17/2015 7:41 AM Performed by: Vista Deck Pre-anesthesia Checklist: Patient identified, Patient being monitored, Emergency Drugs available, Timeout performed and Suction available Patient Re-evaluated:Patient Re-evaluated prior to inductionOxygen Delivery Method: Circle System Utilized Preoxygenation: Pre-oxygenation with 100% oxygen Intubation Type: IV induction Ventilation: Mask ventilation without difficulty LMA: LMA inserted LMA Size: 5.0 Number of attempts: 1 Placement Confirmation: positive ETCO2 and breath sounds checked- equal and bilateral Tube secured with: Tape Dental Injury: Teeth and Oropharynx as per pre-operative assessment

## 2015-07-17 NOTE — Interval H&P Note (Signed)
History and Physical Interval Note:  07/17/2015 7:20 AM  Martin Young  has presented today for surgery, with the diagnosis of right olecranon bursa  The various methods of treatment have been discussed with the patient and family. After consideration of risks, benefits and other options for treatment, the patient has consented to  Procedure(s): OLECRANON BURSA (Right) as a surgical intervention .  The patient's history has been reviewed, patient examined, no change in status, stable for surgery.  I have reviewed the patient's chart and labs.  Questions were answered to the patient's satisfaction.     Arther Abbott

## 2015-07-17 NOTE — Brief Op Note (Signed)
07/17/2015  8:35 AM  PATIENT:  Martin Young  49 y.o. male  PRE-OPERATIVE DIAGNOSIS:  right olecranon bursa  POST-OPERATIVE DIAGNOSIS:  right olecranon bursa  PROCEDURE:  Procedure(s): OLECRANON BURSECTOMY (Right)  SURGEON:  Surgeon(s) and Role:    * Carole Civil, MD - Primary  PHYSICIAN ASSISTANT:   ASSISTANTS: BETTY ASHLEY   ANESTHESIA:   general  EBL:  Total I/O In: 800 [I.V.:800] Out: 10 [Blood:10]  BLOOD ADMINISTERED:none  DRAINS: none   LOCAL MEDICATIONS USED:  MARCAINE     SPECIMEN:  No Specimen  DISPOSITION OF SPECIMEN:  PATHOLOGY  COUNTS:  YES  TOURNIQUET:   Total Tourniquet Time Documented: Upper Arm (Right) - 30 minutes Total: Upper Arm (Right) - 30 minutes   DICTATION: .Viviann Spare Dictation  PLAN OF CARE: Discharge to home after PACU  PATIENT DISPOSITION:  PACU - hemodynamically stable.   Delay start of Pharmacological VTE agent (>24hrs) due to surgical blood loss or risk of bleeding: not applicable

## 2015-07-17 NOTE — Anesthesia Postprocedure Evaluation (Signed)
  Anesthesia Post-op Note  Patient: Martin Young  Procedure(s) Performed: Procedure(s): OLECRANON BURSECTOMY (Right)  Patient Location: PACU  Anesthesia Type:General  Level of Consciousness: awake, alert  and patient cooperative  Airway and Oxygen Therapy: Patient Spontanous Breathing and Patient connected to nasal cannula oxygen  Post-op Pain: none  Post-op Assessment: Post-op Vital signs reviewed, Patient's Cardiovascular Status Stable, Respiratory Function Stable and Patent Airway              Post-op Vital Signs: Reviewed and stable  Last Vitals:  Filed Vitals:   07/17/15 0900  BP: 133/85  Pulse: 78  Temp:   Resp: 16    Complications: No apparent anesthesia complications

## 2015-07-17 NOTE — Anesthesia Preprocedure Evaluation (Signed)
Anesthesia Evaluation  Patient identified by MRN, date of birth, ID band Patient awake    Reviewed: Allergy & Precautions, NPO status , Patient's Chart, lab work & pertinent test results  Airway Mallampati: II  TM Distance: >3 FB     Dental  (+) Teeth Intact   Pulmonary neg pulmonary ROS,    breath sounds clear to auscultation       Cardiovascular hypertension, Pt. on medications  Rhythm:Regular Rate:Normal     Neuro/Psych    GI/Hepatic GERD  Medicated and Controlled,  Endo/Other    Renal/GU      Musculoskeletal   Abdominal   Peds  Hematology   Anesthesia Other Findings   Reproductive/Obstetrics                             Anesthesia Physical Anesthesia Plan  ASA: II  Anesthesia Plan: General   Post-op Pain Management:    Induction: Intravenous  Airway Management Planned: LMA  Additional Equipment:   Intra-op Plan:   Post-operative Plan: Extubation in OR  Informed Consent: I have reviewed the patients History and Physical, chart, labs and discussed the procedure including the risks, benefits and alternatives for the proposed anesthesia with the patient or authorized representative who has indicated his/her understanding and acceptance.     Plan Discussed with:   Anesthesia Plan Comments:         Anesthesia Quick Evaluation

## 2015-07-20 ENCOUNTER — Encounter: Payer: Self-pay | Admitting: Orthopedic Surgery

## 2015-07-20 ENCOUNTER — Ambulatory Visit (INDEPENDENT_AMBULATORY_CARE_PROVIDER_SITE_OTHER): Payer: Managed Care, Other (non HMO) | Admitting: Orthopedic Surgery

## 2015-07-20 VITALS — BP 127/85 | Ht 72.0 in | Wt 207.0 lb

## 2015-07-20 DIAGNOSIS — M7021 Olecranon bursitis, right elbow: Secondary | ICD-10-CM

## 2015-07-20 DIAGNOSIS — Z4789 Encounter for other orthopedic aftercare: Secondary | ICD-10-CM

## 2015-07-20 NOTE — Patient Instructions (Signed)
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

## 2015-07-20 NOTE — Progress Notes (Signed)
Patient ID: Martin Young, male   DOB: January 05, 1966, 49 y.o.   MRN: [id number]  Follow up visit  Chief Complaint  Patient presents with  . Follow-up    post op 1, RIGHT ELBOW BURSA REMOVAL, DOS 07/17/15    BP 127/85 mmHg  Ht 6' (1.829 m)  Wt 207 lb (93.895 kg)  BMI 28.07 kg/m2  No diagnosis found.  Status post right elbow bursectomy. Wound looks clean. Dressing change will be done Monday staples out Thursday  Patient doing well return to work Wednesday light duty no lifting anything heavier than an 8 ounce bottle of water.

## 2015-07-27 ENCOUNTER — Ambulatory Visit (INDEPENDENT_AMBULATORY_CARE_PROVIDER_SITE_OTHER): Payer: Managed Care, Other (non HMO) | Admitting: Orthopedic Surgery

## 2015-07-27 ENCOUNTER — Encounter: Payer: Self-pay | Admitting: Orthopedic Surgery

## 2015-07-27 VITALS — BP 123/84 | Ht 72.0 in | Wt 207.0 lb

## 2015-07-27 DIAGNOSIS — M7021 Olecranon bursitis, right elbow: Secondary | ICD-10-CM

## 2015-07-27 DIAGNOSIS — Z4789 Encounter for other orthopedic aftercare: Secondary | ICD-10-CM

## 2015-07-27 NOTE — Progress Notes (Signed)
Postop visit #2 wound check right elbow. Wound looks clean. Dressing applied and remove staples on Thursday

## 2015-07-28 ENCOUNTER — Ambulatory Visit: Payer: Managed Care, Other (non HMO) | Admitting: Orthopedic Surgery

## 2015-07-30 ENCOUNTER — Encounter: Payer: Self-pay | Admitting: Orthopedic Surgery

## 2015-07-30 ENCOUNTER — Ambulatory Visit (INDEPENDENT_AMBULATORY_CARE_PROVIDER_SITE_OTHER): Payer: Managed Care, Other (non HMO) | Admitting: Orthopedic Surgery

## 2015-07-30 VITALS — BP 123/80 | Ht 72.0 in | Wt 207.0 lb

## 2015-07-30 DIAGNOSIS — M7021 Olecranon bursitis, right elbow: Secondary | ICD-10-CM

## 2015-07-30 DIAGNOSIS — Z4789 Encounter for other orthopedic aftercare: Secondary | ICD-10-CM

## 2015-07-30 NOTE — Progress Notes (Signed)
Patient ID: Martin Young, male   DOB: 02/04/1966, 49 y.o.   MRN: [id number]  Follow up visit  Chief Complaint  Patient presents with  . Follow-up    post op right elbow, staple removal, DOS 07/17/15    BP 123/80 mmHg  Ht 6' (1.829 m)  Wt 207 lb (93.895 kg)  BMI 28.07 kg/m2  Encounter Diagnoses  Name Primary?  . Surgical aftercare, musculoskeletal system Yes  . Olecranon bursitis of right elbow     Staples were removed. The incision was clean.  Return as needed

## 2017-04-17 DIAGNOSIS — I10 Essential (primary) hypertension: Secondary | ICD-10-CM | POA: Diagnosis not present

## 2017-04-17 DIAGNOSIS — E785 Hyperlipidemia, unspecified: Secondary | ICD-10-CM | POA: Diagnosis not present

## 2017-06-06 DIAGNOSIS — R197 Diarrhea, unspecified: Secondary | ICD-10-CM | POA: Diagnosis not present

## 2017-08-16 DIAGNOSIS — I10 Essential (primary) hypertension: Secondary | ICD-10-CM | POA: Diagnosis not present

## 2017-08-16 DIAGNOSIS — E785 Hyperlipidemia, unspecified: Secondary | ICD-10-CM | POA: Diagnosis not present

## 2017-08-16 DIAGNOSIS — Z23 Encounter for immunization: Secondary | ICD-10-CM | POA: Diagnosis not present

## 2018-02-12 DIAGNOSIS — M25521 Pain in right elbow: Secondary | ICD-10-CM | POA: Diagnosis not present

## 2018-02-12 DIAGNOSIS — E785 Hyperlipidemia, unspecified: Secondary | ICD-10-CM | POA: Diagnosis not present

## 2018-02-12 DIAGNOSIS — I10 Essential (primary) hypertension: Secondary | ICD-10-CM | POA: Diagnosis not present

## 2018-02-22 ENCOUNTER — Encounter: Payer: Self-pay | Admitting: Internal Medicine

## 2018-03-21 DIAGNOSIS — I10 Essential (primary) hypertension: Secondary | ICD-10-CM | POA: Diagnosis not present

## 2018-03-22 ENCOUNTER — Ambulatory Visit: Payer: Managed Care, Other (non HMO)

## 2018-04-11 ENCOUNTER — Ambulatory Visit: Payer: Self-pay

## 2018-05-03 ENCOUNTER — Ambulatory Visit: Payer: Self-pay

## 2018-05-15 ENCOUNTER — Ambulatory Visit: Payer: Self-pay

## 2018-05-22 DIAGNOSIS — E785 Hyperlipidemia, unspecified: Secondary | ICD-10-CM | POA: Diagnosis not present

## 2018-05-22 DIAGNOSIS — I10 Essential (primary) hypertension: Secondary | ICD-10-CM | POA: Diagnosis not present

## 2018-07-09 ENCOUNTER — Ambulatory Visit (INDEPENDENT_AMBULATORY_CARE_PROVIDER_SITE_OTHER): Payer: 59 | Admitting: Gastroenterology

## 2018-07-09 ENCOUNTER — Other Ambulatory Visit: Payer: Self-pay | Admitting: *Deleted

## 2018-07-09 ENCOUNTER — Encounter: Payer: Self-pay | Admitting: *Deleted

## 2018-07-09 ENCOUNTER — Encounter: Payer: Self-pay | Admitting: Gastroenterology

## 2018-07-09 ENCOUNTER — Telehealth: Payer: Self-pay | Admitting: *Deleted

## 2018-07-09 DIAGNOSIS — Z1211 Encounter for screening for malignant neoplasm of colon: Secondary | ICD-10-CM

## 2018-07-09 DIAGNOSIS — R1011 Right upper quadrant pain: Secondary | ICD-10-CM | POA: Diagnosis not present

## 2018-07-09 DIAGNOSIS — K219 Gastro-esophageal reflux disease without esophagitis: Secondary | ICD-10-CM

## 2018-07-09 MED ORDER — NA SULFATE-K SULFATE-MG SULF 17.5-3.13-1.6 GM/177ML PO SOLN: 1.0000 | ORAL | 0 refills | Status: DC

## 2018-07-09 NOTE — Progress Notes (Signed)
cc'd to pcp 

## 2018-07-09 NOTE — Assessment & Plan Note (Signed)
Several month h/o ruq pain, in setting of NSAIDs/ASA and chronic PPI. Symptoms somewhat improved after stopping ASA. Watching what he eats. Suspect gastritis/less likely PUD, biliary etiology remains in differential. Patient requesting further evaluation at time of colonoscopy. Would be reasonable to offer EGD.  I have discussed the risks, alternatives, benefits with regards to but not limited to the risk of reaction to medication, bleeding, infection, perforation and the patient is agreeable to proceed. Written consent to be obtained.  Continue omeprazole daily. Limit NSAID use.

## 2018-07-09 NOTE — Progress Notes (Signed)
Primary Care Physician:  Iona Beard, MD  Primary Gastroenterologist:  Garfield Cornea, MD   Chief Complaint  Patient presents with  . Colonoscopy    never had tcs  . Abdominal Pain    some better since stopped ASA; ruq    HPI:  Martin Young is a 52 y.o. male here to schedule first ever screening colonoscopy and for evaluation of abdominal pain.  Patient states he has had some right upper quadrant pain, postprandial for several months.  Initially started after eating steak a couple of days in a row.  At first was pretty significant pain.  No vomiting.  Also a notes right upper quadrant pain if he drank cold beverages. He stopped taking ASA 81mg  daily. His pain is somewhat improved. Does not happen daily. Not as severe but still being careful with what he eats. Still takes ibuprofen as needed.  He has chronic reflux, has been on omeprazole for a couple of years, for the most part his reflux is well controlled unless he skips 2 days of medication.  No dysphagia. Bowel movements are good.  No melena or rectal bleeding.  He was on a daily baby aspirin    Current Outpatient Medications  Medication Sig Dispense Refill  . ibuprofen (ADVIL,MOTRIN) 800 MG tablet Take 800 mg by mouth every 8 (eight) hours as needed.    Marland Kitchen losartan-hydrochlorothiazide (HYZAAR) 50-12.5 MG tablet Take 1 tablet by mouth daily.    Marland Kitchen omeprazole (PRILOSEC OTC) 20 MG tablet Take 20 mg by mouth daily.     No current facility-administered medications for this visit.     Allergies as of 07/09/2018  . (No Known Allergies)    Past Medical History:  Diagnosis Date  . GERD (gastroesophageal reflux disease)   . Hypertension     Past Surgical History:  Procedure Laterality Date  . NERVE REPAIR Left    radial nerve  . OLECRANON BURSECTOMY Right 07/17/2015   Procedure: OLECRANON BURSECTOMY;  Surgeon: Carole Civil, MD;  Location: [facility];  Service: Orthopedics;  Laterality: Right;    Family History  Problem  Relation Age of Onset  . Hypertension Mother   . Diabetes Father        steroid related  . Colon cancer Maternal Uncle     Social History   Socioeconomic History  . Marital status: Married    Spouse name: Not on file  . Number of children: Not on file  . Years of education: Not on file  . Highest education level: Not on file  Occupational History  . Not on file  Social Needs  . Financial resource strain: Not on file  . Food insecurity:    Worry: Not on file    Inability: Not on file  . Transportation needs:    Medical: Not on file    Non-medical: Not on file  Tobacco Use  . Smoking status: Never Smoker  . Smokeless tobacco: Never Used  Substance and Sexual Activity  . Alcohol use: Yes    Alcohol/week: 6.0 standard drinks    Types: 6 Cans of beer per week  . Drug use: No  . Sexual activity: Yes    Birth control/protection: None  Lifestyle  . Physical activity:    Days per week: Not on file    Minutes per session: Not on file  . Stress: Not on file  Relationships  . Social connections:    Talks on phone: Not on file    Gets together:  Not on file    Attends religious service: Not on file    Active member of club or organization: Not on file    Attends meetings of clubs or organizations: Not on file    Relationship status: Not on file  . Intimate partner violence:    Fear of current or ex partner: Not on file    Emotionally abused: Not on file    Physically abused: Not on file    Forced sexual activity: Not on file  Other Topics Concern  . Not on file  Social History Narrative  . Not on file      ROS:  General: Negative for anorexia, weight loss, fever, chills, fatigue, weakness. Eyes: Negative for vision changes.  ENT: Negative for hoarseness, difficulty swallowing , nasal congestion. CV: Negative for chest pain, angina, palpitations, dyspnea on exertion, peripheral edema.  Respiratory: Negative for dyspnea at rest, dyspnea on exertion, cough, sputum,  wheezing.  GI: See history of present illness. GU:  Negative for dysuria, hematuria, urinary incontinence, urinary frequency, nocturnal urination.  MS: Negative for joint pain, low back pain.  Derm: Negative for rash or itching.  Neuro: Negative for weakness, abnormal sensation, seizure, frequent headaches, memory loss, confusion.  Psych: Negative for anxiety, depression, suicidal ideation, hallucinations.  Endo: Negative for unusual weight change.  Heme: Negative for bruising or bleeding. Allergy: Negative for rash or hives.    Physical Examination:  BP 131/85   Pulse 90   Temp 99.1 F (37.3 C) (Oral)   Ht 6\' 1"  (1.854 m)   Wt 208 lb 12.8 oz (94.7 kg)   BMI 27.55 kg/m    General: Well-nourished, well-developed in no acute distress.  Head: Normocephalic, atraumatic.   Eyes: Conjunctiva pink, no icterus. Mouth: Oropharyngeal mucosa moist and pink , no lesions erythema or exudate. Neck: Supple without thyromegaly, masses, or lymphadenopathy.  Lungs: Clear to auscultation bilaterally.  Heart: Regular rate and rhythm, no murmurs rubs or gallops.  Abdomen: Bowel sounds are normal, nontender, nondistended, no hepatosplenomegaly or masses, no abdominal bruits or    hernia , no rebound or guarding.   Rectal: not performed Extremities: No lower extremity edema. No clubbing or deformities.  Neuro: Alert and oriented x 4 , grossly normal neurologically.  Skin: Warm and dry, no rash or jaundice.   Psych: Alert and cooperative, normal mood and affect.

## 2018-07-09 NOTE — Telephone Encounter (Signed)
PA submitted for TCS/EGD via [facility] website. Ref# [id number].

## 2018-07-09 NOTE — Patient Instructions (Signed)
1. Upper endoscopy and colonoscopy as scheduled. See separate instructions.  

## 2018-07-09 NOTE — Assessment & Plan Note (Signed)
First ever screening colonoscopy in near future.  I have discussed the risks, alternatives, benefits with regards to but not limited to the risk of reaction to medication, bleeding, infection, perforation and the patient is agreeable to proceed. Written consent to be obtained.

## 2018-08-16 DIAGNOSIS — L739 Follicular disorder, unspecified: Secondary | ICD-10-CM | POA: Diagnosis not present

## 2018-08-16 DIAGNOSIS — D485 Neoplasm of uncertain behavior of skin: Secondary | ICD-10-CM | POA: Diagnosis not present

## 2018-08-20 DIAGNOSIS — I10 Essential (primary) hypertension: Secondary | ICD-10-CM | POA: Diagnosis not present

## 2018-08-20 DIAGNOSIS — E785 Hyperlipidemia, unspecified: Secondary | ICD-10-CM | POA: Diagnosis not present

## 2018-08-29 ENCOUNTER — Other Ambulatory Visit: Payer: Self-pay

## 2018-08-29 ENCOUNTER — Ambulatory Visit (HOSPITAL_COMMUNITY)
Admission: RE | Admit: 2018-08-29 | Discharge: 2018-08-29 | Disposition: A | Discharge status: Home / Self Care | Payer: 59 | Source: Ambulatory Visit | Attending: Internal Medicine | Admitting: Internal Medicine

## 2018-08-29 ENCOUNTER — Encounter (HOSPITAL_COMMUNITY)
Admission: RE | Disposition: A | Discharge status: Home / Self Care | Payer: Self-pay | Source: Ambulatory Visit | Attending: Internal Medicine

## 2018-08-29 ENCOUNTER — Encounter (HOSPITAL_COMMUNITY): Payer: Self-pay | Admitting: *Deleted

## 2018-08-29 DIAGNOSIS — Z791 Long term (current) use of non-steroidal anti-inflammatories (NSAID): Secondary | ICD-10-CM | POA: Insufficient documentation

## 2018-08-29 DIAGNOSIS — K210 Gastro-esophageal reflux disease with esophagitis: Secondary | ICD-10-CM | POA: Insufficient documentation

## 2018-08-29 DIAGNOSIS — D125 Benign neoplasm of sigmoid colon: Secondary | ICD-10-CM | POA: Insufficient documentation

## 2018-08-29 DIAGNOSIS — I10 Essential (primary) hypertension: Secondary | ICD-10-CM | POA: Diagnosis not present

## 2018-08-29 DIAGNOSIS — D123 Benign neoplasm of transverse colon: Secondary | ICD-10-CM | POA: Diagnosis not present

## 2018-08-29 DIAGNOSIS — Z1211 Encounter for screening for malignant neoplasm of colon: Secondary | ICD-10-CM | POA: Insufficient documentation

## 2018-08-29 DIAGNOSIS — K209 Esophagitis, unspecified: Secondary | ICD-10-CM | POA: Diagnosis not present

## 2018-08-29 DIAGNOSIS — R1011 Right upper quadrant pain: Secondary | ICD-10-CM | POA: Diagnosis not present

## 2018-08-29 DIAGNOSIS — Z79899 Other long term (current) drug therapy: Secondary | ICD-10-CM | POA: Diagnosis not present

## 2018-08-29 DIAGNOSIS — K219 Gastro-esophageal reflux disease without esophagitis: Secondary | ICD-10-CM

## 2018-08-29 HISTORY — PX: ESOPHAGOGASTRODUODENOSCOPY: SHX5428

## 2018-08-29 HISTORY — PX: POLYPECTOMY: SHX5525

## 2018-08-29 HISTORY — PX: COLONOSCOPY: SHX5424

## 2018-08-29 SURGERY — COLONOSCOPY
Anesthesia: Moderate Sedation

## 2018-08-29 MED ORDER — ONDANSETRON HCL 4 MG/2ML IJ SOLN
INTRAMUSCULAR | Status: AC
  Filled 2018-08-29: qty 2

## 2018-08-29 MED ORDER — MEPERIDINE HCL 100 MG/ML IJ SOLN
INTRAMUSCULAR | Status: DC | PRN
  Administered 2018-08-29 (×2): 25 mg via INTRAVENOUS

## 2018-08-29 MED ORDER — MIDAZOLAM HCL 5 MG/5ML IJ SOLN
INTRAMUSCULAR | Status: DC | PRN
  Administered 2018-08-29 (×6): 1 mg via INTRAVENOUS
  Administered 2018-08-29: 2 mg via INTRAVENOUS

## 2018-08-29 MED ORDER — LIDOCAINE VISCOUS HCL 2 % MT SOLN
OROMUCOSAL | Status: DC | PRN
  Administered 2018-08-29: 1 via OROMUCOSAL

## 2018-08-29 MED ORDER — LIDOCAINE VISCOUS HCL 2 % MT SOLN
OROMUCOSAL | Status: AC
  Filled 2018-08-29: qty 15

## 2018-08-29 MED ORDER — STERILE WATER FOR IRRIGATION IR SOLN
Status: DC | PRN
  Administered 2018-08-29: 1.5 mL

## 2018-08-29 MED ORDER — ONDANSETRON HCL 4 MG/2ML IJ SOLN
INTRAMUSCULAR | Status: DC | PRN
  Administered 2018-08-29: 4 mg via INTRAVENOUS

## 2018-08-29 MED ORDER — MEPERIDINE HCL 50 MG/ML IJ SOLN
INTRAMUSCULAR | Status: AC
  Filled 2018-08-29: qty 1

## 2018-08-29 MED ORDER — SODIUM CHLORIDE 0.9 % IV SOLN
INTRAVENOUS | Status: DC
  Administered 2018-08-29: 13:00:00 via INTRAVENOUS

## 2018-08-29 MED ORDER — MIDAZOLAM HCL 5 MG/5ML IJ SOLN
INTRAMUSCULAR | Status: AC
  Filled 2018-08-29: qty 10

## 2018-08-29 NOTE — H&P (Addendum)
_0 @   Primary Care Physician:  Iona Beard, MD Primary Gastroenterologist:  Dr. Gala Romney  Pre-Procedure History & Physical: HPI:  Martin Young is a 52 y.o. male here for further evaluation of right upper quadrant pain via EGD and screening colonoscopy-first ever average risk examination.  Patient denies dysphagia.  Past Medical History:  Diagnosis Date  . GERD (gastroesophageal reflux disease)   . Hypertension     Past Surgical History:  Procedure Laterality Date  . NERVE REPAIR Left    radial nerve  . OLECRANON BURSECTOMY Right 07/17/2015   Procedure: OLECRANON BURSECTOMY;  Surgeon: Carole Civil, MD;  Location: [facility];  Service: Orthopedics;  Laterality: Right;    Prior to Admission medications   Medication Sig Start Date End Date Taking? Authorizing Provider  atorvastatin (LIPITOR) 20 MG tablet Take 20 mg by mouth daily.   Yes [provider]  clotrimazole-betamethasone (LOTRISONE) cream Apply 1 application topically daily as needed (jock itch).   Yes [provider]  ibuprofen (ADVIL,MOTRIN) 800 MG tablet Take 800 mg by mouth 2 (two) times daily as needed for moderate pain.    Yes [provider]  Na Sulfate-K Sulfate-Mg Sulf 17.5-3.13-1.6 GM/177ML SOLN Take 1 kit by mouth as directed. 07/09/18  Yes , Cristopher Estimable, MD  omeprazole (PRILOSEC OTC) 20 MG tablet Take 20 mg by mouth daily.   Yes [provider]  tadalafil (CIALIS) 10 MG tablet Take 5 mg by mouth daily as needed for erectile dysfunction. 06/26/18  Yes [provider]  telmisartan-hydrochlorothiazide (MICARDIS HCT) 40-12.5 MG tablet Take 1 tablet by mouth daily.   Yes [provider]    Allergies as of 07/09/2018  . (No Known Allergies)    Family History  Problem Relation Age of Onset  . Hypertension Mother   . Diabetes Father        steroid related  . Colon cancer Maternal Uncle     Social History   Socioeconomic History  . Marital status:  Married    Spouse name: Not on file  . Number of children: Not on file  . Years of education: Not on file  . Highest education level: Not on file  Occupational History  . Not on file  Social Needs  . Financial resource strain: Not on file  . Food insecurity:    Worry: Not on file    Inability: Not on file  . Transportation needs:    Medical: Not on file    Non-medical: Not on file  Tobacco Use  . Smoking status: Never Smoker  . Smokeless tobacco: Never Used  Substance and Sexual Activity  . Alcohol use: Yes    Alcohol/week: 6.0 standard drinks    Types: 6 Cans of beer per week  . Drug use: No  . Sexual activity: Yes    Birth control/protection: None  Lifestyle  . Physical activity:    Days per week: Not on file    Minutes per session: Not on file  . Stress: Not on file  Relationships  . Social connections:    Talks on phone: Not on file    Gets together: Not on file    Attends religious service: Not on file    Active member of club or organization: Not on file    Attends meetings of clubs or organizations: Not on file    Relationship status: Not on file  . Intimate partner violence:    Fear of current or ex partner: Not on  file    Emotionally abused: Not on file    Physically abused: Not on file    Forced sexual activity: Not on file  Other Topics Concern  . Not on file  Social History Narrative  . Not on file    Review of Systems: See HPI, otherwise negative ROS  Physical Exam: BP (!) 129/93   Pulse 87   Temp 98 F (36.7 C) (Oral)   Resp 13   Ht _0  (1.88 m)   Wt 91.2 kg   SpO2 98%   BMI 25.81 kg/m  General:   Alert,  Well-developed, well-nourished, pleasant and cooperative in NAD Neck:  Supple; no masses or thyromegaly. No significant cervical adenopathy. Lungs:  Clear throughout to auscultation.   No wheezes, crackles, or rhonchi. No acute distress. Heart:  Regular rate and rhythm; no murmurs, clicks, rubs,  or gallops. Abdomen: Non-distended,  normal bowel sounds.  Soft and nontender without appreciable mass or hepatosplenomegaly.  Pulses:  Normal pulses noted. Extremities:  Without clubbing or edema. Impression: Pleasant 52 year old gentleman here for further evaluation of right upper quadrant abdominal pain and first ever average risk screening colonoscopy.  The risks, benefits, limitations, imponderables and alternatives regarding both EGD and colonoscopy have been reviewed with the patient. Questions have been answered. All parties agreeable.      Notice: This dictation was prepared with Dragon dictation along with smaller phrase technology. Any transcriptional errors that result from this process are unintentional and may not be corrected upon review.

## 2018-08-29 NOTE — Op Note (Signed)
[facility] Patient Name: Martin Young Procedure Date: 08/29/2018 11:11 AM MRN: [id number] Date of Birth: 1966/09/24 Attending MD: Norvel Richards , MD CSN: [id number] Age: 52 Admit Type: Outpatient Procedure:                Colonoscopy Indications:              Screening for colorectal malignant neoplasm Providers:                Norvel Richards, MD, Gerome Sam, RN,                            Aram Candela Referring MD:              Medicines:                Midazolam 8 mg IV, Meperidine 50 mg IV Complications:            No immediate complications. Estimated Blood Loss:     Estimated blood loss was minimal. Procedure:                Pre-Anesthesia Assessment:                           - Prior to the procedure, a History and Physical                            was performed, and patient medications and                            allergies were reviewed. The patient's tolerance of                            previous anesthesia was also reviewed. The risks                            and benefits of the procedure and the sedation                            options and risks were discussed with the patient.                            All questions were answered, and informed consent                            was obtained. Prior Anticoagulants: The patient has                            taken no previous anticoagulant or antiplatelet                            agents. ASA Grade Assessment: II - A patient with                            mild systemic disease. After reviewing the risks  and benefits, the patient was deemed in                            satisfactory condition to undergo the procedure.                           After obtaining informed consent, the colonoscope                            was passed under direct vision. Throughout the                            procedure, the patient's blood pressure, pulse, and                             oxygen saturations were monitored continuously. The                            [id number]-HQ190L ([id number]) scope was introduced through                            the anus and advanced to the the cecum, identified                            by appendiceal orifice and ileocecal valve. The                            colonoscopy was performed without difficulty. The                            patient tolerated the procedure well. The quality                            of the bowel preparation was adequate. Scope In: 2:06:12 PM Scope Out: 2:19:57 PM Scope Withdrawal Time: 0 hours 6 minutes 48 seconds  Total Procedure Duration: 0 hours 13 minutes 45 seconds  Findings:      The perianal and digital rectal examinations were normal.      Two sessile polyps were found in the sigmoid colon and hepatic flexure.       The polyps were 5 to 6 mm in size. These polyps were removed with a cold       snare. Resection and retrieval were complete. Estimated blood loss was       minimal.      The exam was otherwise without abnormality on direct and retroflexion       views. Impression:               - Two 5 to 6 mm polyps in the sigmoid colon and at                            the hepatic flexure, removed with a cold snare.                            Resected and retrieved.                           -  The examination was otherwise normal on direct                            and retroflexion views. Moderate Sedation:      Moderate (conscious) sedation was administered by the endoscopy nurse       and supervised by the endoscopist. The following parameters were       monitored: oxygen saturation, heart rate, blood pressure, respiratory       rate, EKG, adequacy of pulmonary ventilation, and response to care.       Total physician intraservice time was 31 minutes. Recommendation:           - Patient has a contact number available for                            emergencies. The signs and symptoms of potential                             delayed complications were discussed with the                            patient. Return to normal activities tomorrow.                            Written discharge instructions were provided to the                            patient.                           - Resume previous diet.                           - Continue present medications.                           - Repeat colonoscopy date to be determined after                            pending pathology results are reviewed for                            surveillance.                           - Return to GI office in 3 months. See EGD report. Procedure Code(s):        --- Professional ---                           [phone], Colonoscopy, flexible; with removal of                            tumor(s), polyp(s), or other lesion(s) by snare                            technique  97026, Moderate sedation; each additional 15                            minutes intraservice time                           G0500, Moderate sedation services provided by the                            same physician or other qualified health care                            professional performing a gastrointestinal                            endoscopic service that sedation supports,                            requiring the presence of an independent trained                            observer to assist in the monitoring of the                            patient's level of consciousness and physiological                            status; initial 15 minutes of intra-service time;                            patient age 16 years or older (additional time may                            be reported with [phone], as appropriate) Diagnosis Code(s):        --- Professional ---                           Z12.11, Encounter for screening for malignant                            neoplasm of colon                           D12.5,  Benign neoplasm of sigmoid colon                           D12.3, Benign neoplasm of transverse colon (hepatic                            flexure or splenic flexure) CPT copyright 2018 American Medical Association. All rights reserved. The codes documented in this report are preliminary and upon coder review may  be revised to meet current compliance requirements. Cristopher Estimable. Tod Abrahamsen, MD Norvel Richards, MD 08/29/2018 2:27:12 PM This report has been signed electronically. Number of Addenda: 0

## 2018-08-29 NOTE — Op Note (Signed)
[facility] Patient Name: Martin Young Procedure Date: 08/29/2018 11:16 AM MRN: [id number] Date of Birth: 23-Jun-1966 Attending MD: Norvel Richards , MD CSN: [id number] Age: 52 Admit Type: Outpatient Procedure:                Upper GI endoscopy Indications:              Abdominal pain in the right upper quadrant Providers:                Norvel Richards, MD, Gerome Sam, RN,                            Aram Candela Referring MD:              Medicines:                Midazolam 4 mg IV, Meperidine 50 mg IV, Ondansetron                            4 mg IV Complications:            No immediate complications. Estimated Blood Loss:     Estimated blood loss was minimal. Procedure:                Pre-Anesthesia Assessment:                           - Prior to the procedure, a History and Physical                            was performed, and patient medications and                            allergies were reviewed. The patient's tolerance of                            previous anesthesia was also reviewed. The risks                            and benefits of the procedure and the sedation                            options and risks were discussed with the patient.                            All questions were answered, and informed consent                            was obtained. Prior Anticoagulants: The patient has                            taken no previous anticoagulant or antiplatelet                            agents. ASA Grade Assessment: II - A patient with  mild systemic disease. After reviewing the risks                            and benefits, the patient was deemed in                            satisfactory condition to undergo the procedure.                           After obtaining informed consent, the endoscope was                            passed under direct vision. Throughout the                            procedure, the  patient's blood pressure, pulse, and                            oxygen saturations were monitored continuously. The                            [id number]-[id number] ([id number]) scope was introduced through the                            mouth, and advanced to the second part of duodenum.                            The upper GI endoscopy was accomplished without                            difficulty. The patient tolerated the procedure                            well. Scope In: 1:57:16 PM Scope Out: 2:00:30 PM Total Procedure Duration: 0 hours 3 minutes 14 seconds  Findings:      Esophagitis was found. Couple of distal esophageal erosions within 5 mm       of the GE junction. Otherwise, normal esophagus.      The entire examined stomach was normal.      The duodenal bulb and second portion of the duodenum were normal. Impression:               -Mild erosive reflux esophagitis.                           - Normal stomach.                           - Normal duodenal bulb and second portion of the                            duodenum. Moderate Sedation:      Moderate (conscious) sedation was administered by the endoscopy nurse       and supervised by the endoscopist. The following parameters were       monitored: oxygen saturation, heart  rate, blood pressure, respiratory       rate, EKG, adequacy of pulmonary ventilation, and response to care.       Total physician intraservice time was 12 minutes. Recommendation:           - Patient has a contact number available for                            emergencies. The signs and symptoms of potential                            delayed complications were discussed with the                            patient. Return to normal activities tomorrow.                            Written discharge instructions were provided to the                            patient.                           - Advance diet as tolerated.                           - Continue present  medications. Stop omeprazole.                            Begin Protonix 40 mg daily. See colonoscopy report.                           - No repeat upper endoscopy.                           - Return to GI office in 3 months. Procedure Code(s):        --- Professional ---                           [phone], Esophagogastroduodenoscopy, flexible,                            transoral; diagnostic, including collection of                            specimen(s) by brushing or washing, when performed                            (separate procedure)                           G0500, Moderate sedation services provided by the                            same physician or other qualified health care  professional performing a gastrointestinal                            endoscopic service that sedation supports,                            requiring the presence of an independent trained                            observer to assist in the monitoring of the                            patient's level of consciousness and physiological                            status; initial 15 minutes of intra-service time;                            patient age 52 years or older (additional time may                            be reported with [phone], as appropriate) Diagnosis Code(s):        --- Professional ---                           K20.9, Esophagitis, unspecified                           R10.11, Right upper quadrant pain CPT copyright 2018 American Medical Association. All rights reserved. The codes documented in this report are preliminary and upon coder review may  be revised to meet current compliance requirements. Cristopher Estimable. Roch Quach, MD Norvel Richards, MD 08/29/2018 2:23:59 PM This report has been signed electronically. Number of Addenda: 0

## 2018-08-29 NOTE — Discharge Instructions (Signed)
°Colonoscopy °Discharge Instructions ° °Read the instructions outlined below and refer to this sheet in the next few weeks. These discharge instructions provide you with general information on caring for yourself after you leave the hospital. Your doctor may also give you specific instructions. While your treatment has been planned according to the most current medical practices available, unavoidable complications occasionally occur. If you have any problems or questions after discharge, call Dr. Rourk at [phone]. °ACTIVITY °· You may resume your regular activity, but move at a slower pace for the next 24 hours.  °· Take frequent rest periods for the next 24 hours.  °· Walking will help get rid of the air and reduce the bloated feeling in your belly (abdomen).  °· No driving for 24 hours (because of the medicine (anesthesia) used during the test).   °· Do not sign any important legal documents or operate any machinery for 24 hours (because of the anesthesia used during the test).  °NUTRITION °· Drink plenty of fluids.  °· You may resume your normal diet as instructed by your doctor.  °· Begin with a light meal and progress to your normal diet. Heavy or fried foods are harder to digest and may make you feel sick to your stomach (nauseated).  °· Avoid alcoholic beverages for 24 hours or as instructed.  °MEDICATIONS °· You may resume your normal medications unless your doctor tells you otherwise.  °WHAT YOU CAN EXPECT TODAY °· Some feelings of bloating in the abdomen.  °· Passage of more gas than usual.  °· Spotting of blood in your stool or on the toilet paper.  °IF YOU HAD POLYPS REMOVED DURING THE COLONOSCOPY: °· No aspirin products for 7 days or as instructed.  °· No alcohol for 7 days or as instructed.  °· Eat a soft diet for the next 24 hours.  °FINDING OUT THE RESULTS OF YOUR TEST °Not all test results are available during your visit. If your test results are not back during the visit, make an appointment  with your caregiver to find out the results. Do not assume everything is normal if you have not heard from your caregiver or the medical facility. It is important for you to follow up on all of your test results.  °SEEK IMMEDIATE MEDICAL ATTENTION IF: °· You have more than a spotting of blood in your stool.  °· Your belly is swollen (abdominal distention).  °· You are nauseated or vomiting.  °· You have a temperature over 101.  °· You have abdominal pain or discomfort that is severe or gets worse throughout the day.  °EGD °Discharge instructions °Please read the instructions outlined below and refer to this sheet in the next few weeks. These discharge instructions provide you with general information on caring for yourself after you leave the hospital. Your doctor may also give you specific instructions. While your treatment has been planned according to the most current medical practices available, unavoidable complications occasionally occur. If you have any problems or questions after discharge, please call your doctor. °ACTIVITY °· You may resume your regular activity but move at a slower pace for the next 24 hours.  °· Take frequent rest periods for the next 24 hours.  °· Walking will help expel (get rid of) the air and reduce the bloated feeling in your abdomen.  °· No driving for 24 hours (because of the anesthesia (medicine) used during the test).  °· You may shower.  °· Do not sign any important   legal documents or operate any machinery for 24 hours (because of the anesthesia used during the test).  NUTRITION  Drink plenty of fluids.   You may resume your normal diet.   Begin with a light meal and progress to your normal diet.   Avoid alcoholic beverages for 24 hours or as instructed by your caregiver.  MEDICATIONS  You may resume your normal medications unless your caregiver tells you otherwise.  WHAT YOU CAN EXPECT TODAY  You may experience abdominal discomfort such as a feeling of fullness  or gas pains.  FOLLOW-UP  Your doctor will discuss the results of your test with you.  SEEK IMMEDIATE MEDICAL ATTENTION IF ANY OF THE FOLLOWING OCCUR:  Excessive nausea (feeling sick to your stomach) and/or vomiting.   Severe abdominal pain and distention (swelling).   Trouble swallowing.   Temperature over 101 F (37.8 C).   Rectal bleeding or vomiting of blood.    GERD information provided  Colon polyp information provided  Stop omeprazole; begin Protonix 40 mg daily  Further recommendations to follow pending review of pathology report  Office visit with [place] in 3 months.  Feb 27th at 10:30 am  Gastroesophageal Reflux Disease, Adult Normally, food travels down the esophagus and stays in the stomach to be digested. However, when a person has gastroesophageal reflux disease (GERD), food and stomach acid move back up into the esophagus. When this happens, the esophagus becomes sore and inflamed. Over time, GERD can create small holes (ulcers) in the lining of the esophagus. What are the causes? This condition is caused by a problem with the muscle between the esophagus and the stomach (lower esophageal sphincter, or LES). Normally, the LES muscle closes after food passes through the esophagus to the stomach. When the LES is weakened or abnormal, it does not close properly, and that allows food and stomach acid to go back up into the esophagus. The LES can be weakened by certain dietary substances, medicines, and medical conditions, including:  Tobacco use.  Pregnancy.  Having a hiatal hernia.  Heavy alcohol use.  Certain foods and beverages, such as coffee, chocolate, onions, and peppermint.  What increases the risk? This condition is more likely to develop in:  People who have an increased body weight.  People who have connective tissue disorders.  People who use NSAID medicines.  What are the signs or symptoms? Symptoms of this condition  include:  Heartburn.  Difficult or painful swallowing.  The feeling of having a lump in the throat.  Abitter taste in the mouth.  Bad breath.  Having a large amount of saliva.  Having an upset or bloated stomach.  Belching.  Chest pain.  Shortness of breath or wheezing.  Ongoing (chronic) cough or a night-time cough.  Wearing away of tooth enamel.  Weight loss.  Different conditions can cause chest pain. Make sure to see your health care provider if you experience chest pain. How is this diagnosed? Your health care provider will take a medical history and perform a physical exam. To determine if you have mild or severe GERD, your health care provider may also monitor how you respond to treatment. You may also have other tests, including:  An endoscopy toexamine your stomach and esophagus with a small camera.  A test thatmeasures the acidity level in your esophagus.  A test thatmeasures how much pressure is on your esophagus.  A barium swallow or modified barium swallow to show the shape, size, and functioning of your esophagus.  How is this treated? The goal of treatment is to help relieve your symptoms and to prevent complications. Treatment for this condition may vary depending on how severe your symptoms are. Your health care provider may recommend:  Changes to your diet.  Medicine.  Surgery.  Follow these instructions at home: Diet  Follow a diet as recommended by your health care provider. This may involve avoiding foods and drinks such as: ? Coffee and tea (with or without caffeine). ? Drinks that containalcohol. ? Energy drinks and sports drinks. ? Carbonated drinks or sodas. ? Chocolate and cocoa. ? Peppermint and mint flavorings. ? Garlic and onions. ? Horseradish. ? Spicy and acidic foods, including peppers, chili powder, curry powder, vinegar, hot sauces, and barbecue sauce. ? Citrus fruit juices and citrus fruits, such as oranges, lemons,  and limes. ? Tomato-based foods, such as red sauce, chili, salsa, and pizza with red sauce. ? Fried and fatty foods, such as donuts, french fries, potato chips, and high-fat dressings. ? High-fat meats, such as hot dogs and fatty cuts of red and white meats, such as rib eye steak, sausage, ham, and bacon. ? High-fat dairy items, such as whole milk, butter, and cream cheese.  Eat small, frequent meals instead of large meals.  Avoid drinking large amounts of liquid with your meals.  Avoid eating meals during the 2-3 hours before bedtime.  Avoid lying down right after you eat.  Do not exercise right after you eat. General instructions  Pay attention to any changes in your symptoms.  Take over-the-counter and prescription medicines only as told by your health care provider. Do not take aspirin, ibuprofen, or other NSAIDs unless your health care provider told you to do so.  Do not use any tobacco products, including cigarettes, chewing tobacco, and e-cigarettes. If you need help quitting, ask your health care provider.  Wear loose-fitting clothing. Do not wear anything tight around your waist that causes pressure on your abdomen.  Raise (elevate) the head of your bed 6 inches (15cm).  Try to reduce your stress, such as with yoga or meditation. If you need help reducing stress, ask your health care provider.  If you are overweight, reduce your weight to an amount that is healthy for you. Ask your health care provider for guidance about a safe weight loss goal.  Keep all follow-up visits as told by your health care provider. This is important. Contact a health care provider if:  You have new symptoms.  You have unexplained weight loss.  You have difficulty swallowing, or it hurts to swallow.  You have wheezing or a persistent cough.  Your symptoms do not improve with treatment.  You have a hoarse voice. Get help right away if:  You have pain in your arms, neck, jaw, teeth, or  back.  You feel sweaty, dizzy, or light-headed.  You have chest pain or shortness of breath.  You vomit and your vomit looks like blood or coffee grounds.  You faint.  Your stool is bloody or black.  You cannot swallow, drink, or eat. This information is not intended to replace advice given to you by your health care provider. Make sure you discuss any questions you have with your health care provider. Document Released: 06/29/2005 Document Revised: 02/17/2016 Document Reviewed: 01/14/2015 Elsevier Interactive Patient Education  2018 [employer].  Colon Polyps Polyps are tissue growths inside the body. Polyps can grow in many places, including the large intestine (colon). A polyp may be a round bump  or a mushroom-shaped growth. You could have one polyp or several. Most colon polyps are noncancerous (benign). However, some colon polyps can become cancerous over time. What are the causes? The exact cause of colon polyps is not known. What increases the risk? This condition is more likely to develop in people who:  Have a family history of colon cancer or colon polyps.  Are older than 17 or older than 45 if they are African American.  Have inflammatory bowel disease, such as ulcerative colitis or Crohn disease.  Are overweight.  Smoke cigarettes.  Do not get enough exercise.  Drink too much alcohol.  Eat a diet that is: ? High in fat and red meat. ? Low in fiber.  Had childhood cancer that was treated with abdominal radiation.  What are the signs or symptoms? Most polyps do not cause symptoms. If you have symptoms, they may include:  Blood coming from your rectum when having a bowel movement.  Blood in your stool.The stool may look dark red or black.  A change in bowel habits, such as constipation or diarrhea.  How is this diagnosed? This condition is diagnosed with a colonoscopy. This is a procedure that uses a lighted, flexible scope to look at the inside of  your colon. How is this treated? Treatment for this condition involves removing any polyps that are found. Those polyps will then be tested for cancer. If cancer is found, your health care provider will talk to you about options for colon cancer treatment. Follow these instructions at home: Diet  Eat plenty of fiber, such as fruits, vegetables, and whole grains.  Eat foods that are high in calcium and vitamin D, such as milk, cheese, yogurt, eggs, liver, fish, and broccoli.  Limit foods high in fat, red meats, and processed meats, such as hot dogs, sausage, bacon, and lunch meats.  Maintain a healthy weight, or lose weight if recommended by your health care provider. General instructions  Do not smoke cigarettes.  Do not drink alcohol excessively.  Keep all follow-up visits as told by your health care provider. This is important. This includes keeping regularly scheduled colonoscopies. Talk to your health care provider about when you need a colonoscopy.  Exercise every day or as told by your health care provider. Contact a health care provider if:  You have new or worsening bleeding during a bowel movement.  You have new or increased blood in your stool.  You have a change in bowel habits.  You unexpectedly lose weight. This information is not intended to replace advice given to you by your health care provider. Make sure you discuss any questions you have with your health care provider. Document Released: 06/15/2004 Document Revised: 02/25/2016 Document Reviewed: 08/10/2015 Elsevier Interactive Patient Education  [employer].

## 2018-09-04 ENCOUNTER — Encounter: Payer: Self-pay | Admitting: Internal Medicine

## 2018-09-06 ENCOUNTER — Encounter (HOSPITAL_COMMUNITY): Payer: Self-pay | Admitting: Internal Medicine

## 2018-11-29 ENCOUNTER — Ambulatory Visit: Payer: 59 | Admitting: Gastroenterology

## 2018-11-29 ENCOUNTER — Telehealth: Payer: Self-pay | Admitting: Gastroenterology

## 2018-11-29 ENCOUNTER — Encounter: Payer: Self-pay | Admitting: Gastroenterology

## 2018-11-29 NOTE — Telephone Encounter (Signed)
Patient was a no show and letter sent  °

## 2018-12-17 DIAGNOSIS — S8012XA Contusion of left lower leg, initial encounter: Secondary | ICD-10-CM | POA: Diagnosis not present

## 2018-12-17 DIAGNOSIS — E785 Hyperlipidemia, unspecified: Secondary | ICD-10-CM | POA: Diagnosis not present

## 2018-12-17 DIAGNOSIS — I10 Essential (primary) hypertension: Secondary | ICD-10-CM | POA: Diagnosis not present

## 2019-02-16 ENCOUNTER — Other Ambulatory Visit: Payer: Self-pay | Admitting: Internal Medicine

## 2019-04-15 ENCOUNTER — Other Ambulatory Visit: Payer: Self-pay

## 2019-04-15 DIAGNOSIS — Z20822 Contact with and (suspected) exposure to covid-19: Secondary | ICD-10-CM

## 2019-04-15 NOTE — Progress Notes (Signed)
[id number] 

## 2019-04-19 LAB — NOVEL CORONAVIRUS, NAA: SARS-CoV-2, NAA: NOT DETECTED

## 2019-11-19 ENCOUNTER — Telehealth: Payer: Self-pay

## 2019-11-19 NOTE — Telephone Encounter (Signed)
Martin Young, waiting on a return call from pt. When pt returns call, I'm going to ask pt if he wants to change the medication to Lansoprazole or stay with Pantoprazole.

## 2019-11-19 NOTE — Telephone Encounter (Signed)
Noted  

## 2019-11-19 NOTE — Telephone Encounter (Signed)
Pt returned call. Pt would like to keep the Pantoprazole for $25.00 (90 day supply). Pt feels the medication is working well for him and doesn't want to change the medication. I called [facility] and they will keep the Pantoprazole on file for pt.   LSL- please disregard this message.

## 2019-11-19 NOTE — Telephone Encounter (Signed)
Received a fax from [facility] that pt is requesting an alternative to Pantoprazole. Paying $25.00 for 90 day supply.  Requesting alternative Lansoprazole.( $12.00)  Routing to Neil Crouch, PA who last saw pt to advise and to AM since this is RMR pt.

## 2020-06-09 ENCOUNTER — Other Ambulatory Visit: Payer: Self-pay | Admitting: Gastroenterology

## 2020-07-07 ENCOUNTER — Other Ambulatory Visit: Payer: Self-pay | Admitting: Nurse Practitioner

## 2020-07-10 NOTE — Telephone Encounter (Signed)
Please tell the patient he hasn't been seen in about 2 years. I will send in limited refill but will need follow-up visit for further refills.

## 2020-09-05 ENCOUNTER — Encounter: Payer: Self-pay | Admitting: Emergency Medicine

## 2020-09-05 ENCOUNTER — Other Ambulatory Visit: Payer: Self-pay

## 2020-09-05 ENCOUNTER — Ambulatory Visit
Admission: EM | Admit: 2020-09-05 | Discharge: 2020-09-05 | Disposition: A | Discharge status: Home / Self Care | Payer: 59 | Attending: Emergency Medicine | Admitting: Emergency Medicine

## 2020-09-05 DIAGNOSIS — L0211 Cutaneous abscess of neck: Secondary | ICD-10-CM

## 2020-09-05 MED ORDER — DOXYCYCLINE HYCLATE 100 MG PO CAPS: 100.0000 mg | ORAL_CAPSULE | Freq: Two times a day (BID) | ORAL | 0 refills | Status: DC

## 2020-09-05 NOTE — Discharge Instructions (Addendum)
Apply warm compresses 3-4x daily for 10-15 minutes Wash site daily with warm water and mild soap Take antibiotic as prescribed and to completion Follow up here or with PCP if symptoms persists Return or go to the ED if you have any new or worsening symptoms increased redness, swelling, pain, nausea, vomiting, fever, chills, etc..Marland Kitchen

## 2020-09-05 NOTE — ED Provider Notes (Signed)
[facility]   [id number] 09/05/20 Arrival Time: 0940   [id number]  SUBJECTIVE:  Martin Young is a 54 y.o. male who presents with a possible abscess of his left side of his neck for the past 2 days.  Denies any precipitating event.  Has tried OTC clindamycin cortisone cream without relief.  Denies alleviating or aggravating factor.  Denies chills, fever, nausea, vomiting, diarrhea.  ROS: As per HPI.  All other pertinent ROS negative.     Past Medical History:  Diagnosis Date  . GERD (gastroesophageal reflux disease)   . Hypertension    Past Surgical History:  Procedure Laterality Date  . COLONOSCOPY N/A 08/29/2018   Procedure: COLONOSCOPY;  Surgeon: Daneil Dolin, MD;  Location: [facility];  Service: Endoscopy;  Laterality: N/A;  1:15pm  . ESOPHAGOGASTRODUODENOSCOPY N/A 08/29/2018   Procedure: ESOPHAGOGASTRODUODENOSCOPY (EGD);  Surgeon: Daneil Dolin, MD;  Location: [facility];  Service: Endoscopy;  Laterality: N/A;  . NERVE REPAIR Left    radial nerve  . OLECRANON BURSECTOMY Right 07/17/2015   Procedure: OLECRANON BURSECTOMY;  Surgeon: Carole Civil, MD;  Location: [facility];  Service: Orthopedics;  Laterality: Right;  . POLYPECTOMY  08/29/2018   Procedure: POLYPECTOMY;  Surgeon: Daneil Dolin, MD;  Location: [facility];  Service: Endoscopy;;  (colon)   No Known Allergies No current facility-administered medications on file prior to encounter.   Current Outpatient Medications on File Prior to Encounter  Medication Sig Dispense Refill  . atorvastatin (LIPITOR) 20 MG tablet Take 20 mg by mouth daily.    . clotrimazole-betamethasone (LOTRISONE) cream Apply 1 application topically daily as needed (jock itch).    Marland Kitchen ibuprofen (ADVIL,MOTRIN) 800 MG tablet Take 800 mg by mouth 2 (two) times daily as needed for moderate pain.     . Na Sulfate-K Sulfate-Mg Sulf 17.5-3.13-1.6 GM/177ML SOLN Take 1 kit by mouth as directed. 1 Bottle 0  . omeprazole (PRILOSEC  OTC) 20 MG tablet Take 20 mg by mouth daily.    . pantoprazole (PROTONIX) 40 MG tablet TAKE 1 TABLET BY MOUTH EVERY DAY 30 tablet 2  . tadalafil (CIALIS) 10 MG tablet Take 5 mg by mouth daily as needed for erectile dysfunction.  3  . telmisartan-hydrochlorothiazide (MICARDIS HCT) 40-12.5 MG tablet Take 1 tablet by mouth daily.     Social History   Socioeconomic History  . Marital status: Married    Spouse name: Not on file  . Number of children: Not on file  . Years of education: Not on file  . Highest education level: Not on file  Occupational History  . Not on file  Tobacco Use  . Smoking status: Never Smoker  . Smokeless tobacco: Never Used  Vaping Use  . Vaping Use: Never used  Substance and Sexual Activity  . Alcohol use: Yes    Alcohol/week: 6.0 standard drinks    Types: 6 Cans of beer per week  . Drug use: No  . Sexual activity: Yes    Birth control/protection: None  Other Topics Concern  . Not on file  Social History Narrative  . Not on file   Social Determinants of Health   Financial Resource Strain:   . Difficulty of Paying Living Expenses: Not on file  Food Insecurity:   . Worried About Charity fundraiser in the Last Year: Not on file  . Ran Out of Food in the Last Year: Not on file  Transportation Needs:   . Lack of  Transportation (Medical): Not on file  . Lack of Transportation (Non-Medical): Not on file  Physical Activity:   . Days of Exercise per Week: Not on file  . Minutes of Exercise per Session: Not on file  Stress:   . Feeling of Stress : Not on file  Social Connections:   . Frequency of Communication with Friends and Family: Not on file  . Frequency of Social Gatherings with Friends and Family: Not on file  . Attends Religious Services: Not on file  . Active Member of Clubs or Organizations: Not on file  . Attends Archivist Meetings: Not on file  . Marital Status: Not on file  Intimate Partner Violence:   . Fear of Current or  Ex-Partner: Not on file  . Emotionally Abused: Not on file  . Physically Abused: Not on file  . Sexually Abused: Not on file   Family History  Problem Relation Age of Onset  . Hypertension Mother   . Diabetes Father        steroid related  . Colon cancer Maternal Uncle     OBJECTIVE:  Vitals:   09/05/20 1049  BP: 126/87  Pulse: 74  Resp: 16  Temp: 97.9 F (36.6 C)  SpO2: 98%     General appearance: alert; no distress Chest: CTA, heart sounds normal Heart: RRR, no rub, gallop or murmur Skin: 2 cm cm induration of his left side of neck; tender to touch with white purulent drainage Psychological: alert and cooperative; normal mood and affect   ASSESSMENT & PLAN:  1. Cutaneous abscess of neck     Meds ordered this encounter  Medications  . doxycycline (VIBRAMYCIN) 100 MG capsule    Sig: Take 1 capsule (100 mg total) by mouth 2 (two) times daily.    Dispense:  20 capsule    Refill:  0    Discharge instructions  Apply warm compresses 3-4x daily for 10-15 minutes Wash site daily with warm water and mild soap Take antibiotic as prescribed and to completion Follow up here or with PCP if symptoms persists Return or go to the ED if you have any new or worsening symptoms increased redness, swelling, pain, nausea, vomiting, fever, chills, etc...   Reviewed expectations re: course of current medical issues. Questions answered. Outlined signs and symptoms indicating need for more acute intervention. Patient verbalized understanding. After Visit Summary given.          Emerson Monte, FNP 09/05/20 1128

## 2020-09-05 NOTE — ED Triage Notes (Signed)
Patient has abcess on left side of neck x 2 days- most likely foliculitis. has some clyndamycin cream

## 2020-09-06 ENCOUNTER — Other Ambulatory Visit: Payer: Self-pay | Admitting: Nurse Practitioner

## 2020-09-09 NOTE — Telephone Encounter (Signed)
Patient needs Ov for further refills. Hasn't been seen since 2019.

## 2020-09-10 ENCOUNTER — Encounter: Payer: Self-pay | Admitting: Gastroenterology

## 2020-09-10 NOTE — Telephone Encounter (Signed)
Sent letter to patient that they needed office visit

## 2020-12-23 NOTE — Progress Notes (Signed)
Referring Provider: Iona Beard, MD Primary Care Physician:  Iona Beard, MD Primary GI Physician: Dr. Gala Romney  Chief Complaint  Patient presents with  . Follow-up    Needs refill for Pantoprazole    HPI:   Martin Young is a 55 y.o. male presenting today for follow-up of GERD with need for refills on Protonix.   Last seen in our office October 2019 for postprandial RUQ pain and need for screening colonoscopy.  He was scheduled for EGD and colonoscopy.  Procedures 08/29/2018: Colonoscopy: Two 5 to 6 mm polyps removed, otherwise normal exam.  Pathology with benign colonic mucosal and 1 tubular adenoma.  Recommended repeat colonoscopy in 5 years. EGD: Mild erosive reflux esophagitis, normal examined stomach and duodenum.  Recommended stopping omeprazole and starting Protonix 40 mg daily.  Today:  GERD is well controlled on Protonix 40 mg daily. No abdominal pain, dysphagia, nausea, or vomiting. No constipation, diarrhea, brbpr, or melena. No unintentional weight loss.   Past Medical History:  Diagnosis Date  . GERD (gastroesophageal reflux disease)   . HLD (hyperlipidemia)   . Hypertension     Past Surgical History:  Procedure Laterality Date  . COLONOSCOPY N/A 08/29/2018   Surgeon: Daneil Dolin, MD;  Two 5 to 6 mm polyps removed, otherwise normal exam.  Pathology with benign colonic mucosal and 1 tubular adenoma.  Recommended repeat colonoscopy in 5 years.  . ESOPHAGOGASTRODUODENOSCOPY N/A 08/29/2018   Surgeon: Daneil Dolin, MD;  Mild erosive reflux esophagitis, normal examined stomach and duodenum.  Recommended stopping omeprazole and starting Protonix 40 mg daily.  Marland Kitchen NERVE REPAIR Left    radial nerve  . OLECRANON BURSECTOMY Right 07/17/2015   Procedure: OLECRANON BURSECTOMY;  Surgeon: Carole Civil, MD;  Location: [facility];  Service: Orthopedics;  Laterality: Right;  . POLYPECTOMY  08/29/2018   Procedure: POLYPECTOMY;  Surgeon: Daneil Dolin, MD;  [facility]  [facility];  Service: Endoscopy;;  (colon)    Current Outpatient Medications  Medication Sig Dispense Refill  . atorvastatin (LIPITOR) 20 MG tablet Take 20 mg by mouth daily.    . clotrimazole-betamethasone (LOTRISONE) cream Apply 1 application topically daily as needed (jock itch).    Marland Kitchen ibuprofen (ADVIL,MOTRIN) 800 MG tablet Take 800 mg by mouth as needed for moderate pain.    Marland Kitchen telmisartan-hydrochlorothiazide (MICARDIS HCT) 40-12.5 MG tablet Take 1 tablet by mouth daily.    . pantoprazole (PROTONIX) 40 MG tablet Take 1 tablet (40 mg total) by mouth daily. 90 tablet 3  . tadalafil (CIALIS) 10 MG tablet Take 5 mg by mouth daily as needed for erectile dysfunction. (Patient not taking: Reported on 12/24/2020)  3   No current facility-administered medications for this visit.    Allergies as of 12/24/2020  . (No Known Allergies)    Family History  Problem Relation Age of Onset  . Hypertension Mother   . Diabetes Father        steroid related  . Colon cancer Maternal Uncle        diagnosed in 78s    Social History   Socioeconomic History  . Marital status: Married    Spouse name: Not on file  . Number of children: Not on file  . Years of education: Not on file  . Highest education level: Not on file  Occupational History  . Not on file  Tobacco Use  . Smoking status: Never Smoker  . Smokeless tobacco: Never Used  Vaping Use  . Vaping  Use: Never used  Substance and Sexual Activity  . Alcohol use: Yes    Alcohol/week: 6.0 standard drinks    Types: 6 Cans of beer per week  . Drug use: No  . Sexual activity: Yes    Birth control/protection: None  Other Topics Concern  . Not on file  Social History Narrative  . Not on file   Social Determinants of Health   Financial Resource Strain: Not on file  Food Insecurity: Not on file  Transportation Needs: Not on file  Physical Activity: Not on file  Stress: Not on file  Social Connections: Not on file    Review of  Systems: Gen: Denies fever, chills, cold or flu like symptoms, pre-syncope, or syncope.  CV: Denies chest pain or palpitations Resp: Denies dyspnea or cough GI: See HPI  Heme: See HPI  Physical Exam: BP (!) 147/93   Pulse 82   Temp (!) 96.9 F (36.1 C) (Temporal)   Ht 6\' 2"  (1.88 m)   Wt 211 lb 12.8 oz (96.1 kg)   BMI 27.19 kg/m  General:   Alert and oriented. No distress noted. Pleasant and cooperative.  Head:  Normocephalic and atraumatic. Eyes:  Conjuctiva clear without scleral icterus. Heart:  S1, S2 present without murmurs appreciated. Lungs:  Clear to auscultation bilaterally. No wheezes, rales, or rhonchi. No distress.  Abdomen:  +BS, soft, non-tender and non-distended. No rebound or guarding. No HSM or masses noted. Msk:  Symmetrical without gross deformities. Normal posture. Extremities:  Without edema. Neurologic:  Alert and  oriented x4 Psych: Normal mood and affect.   Assessment:  55 y.o. male with history of GERD and mild reflux esophagitis on EGD in 2019 presenting today for follow-up with need for refills. GERD is well controlled on Protonix 40 mg daily. No alarm symptoms. No other significant GI symptoms.   Colonoscopy up to date in 2019, due for repeat in 2024.  Plan:  1.  Continue Protonix 40 mg daily 30 minutes before breakfast. 1 years worth of refills sent to pharmacy.  2.  Colonoscopy in 2024.  3.  Follow-up in 1 year or sooner if needed.     Aliene Altes, PA-C [facility] 12/24/2020

## 2020-12-24 ENCOUNTER — Encounter: Payer: Self-pay | Admitting: Gastroenterology

## 2020-12-24 ENCOUNTER — Other Ambulatory Visit: Payer: Self-pay

## 2020-12-24 ENCOUNTER — Ambulatory Visit (INDEPENDENT_AMBULATORY_CARE_PROVIDER_SITE_OTHER): Payer: 59 | Admitting: Gastroenterology

## 2020-12-24 VITALS — BP 147/93 | HR 82 | Temp 96.9°F | Ht 74.0 in | Wt 211.8 lb

## 2020-12-24 DIAGNOSIS — K219 Gastro-esophageal reflux disease without esophagitis: Secondary | ICD-10-CM | POA: Diagnosis not present

## 2020-12-24 MED ORDER — PANTOPRAZOLE SODIUM 40 MG PO TBEC: 40.0000 mg | DELAYED_RELEASE_TABLET | Freq: Every day | ORAL | 3 refills | Status: DC

## 2020-12-24 NOTE — Patient Instructions (Signed)
Continue taking Protonix 40 mg daily 30 minutes before breakfast.  I have sent 1 years worth of refills to your pharmacy.  We will plan to see back in 1 year.  Do not hesitate to call if you have any questions or concerns prior.   It was great to meet you today!  Aliene Altes, PA-C [facility]

## 2021-12-23 ENCOUNTER — Encounter: Payer: Self-pay | Admitting: Internal Medicine

## 2022-01-05 ENCOUNTER — Other Ambulatory Visit: Payer: Self-pay | Admitting: Gastroenterology

## 2022-01-05 DIAGNOSIS — K219 Gastro-esophageal reflux disease without esophagitis: Secondary | ICD-10-CM

## 2022-01-05 NOTE — Telephone Encounter (Signed)
Sending in 6 month supply. Please remind patient he needs an OV within the next 6 months.  ?

## 2022-01-05 NOTE — Telephone Encounter (Signed)
Noted  

## 2022-01-05 NOTE — Telephone Encounter (Signed)
Last ov 12/24/20 ?

## 2022-02-01 NOTE — Progress Notes (Signed)
? ?Primary Care Physician:  Iona Beard, MD  ?Primary GI: Dr. Gala Romney ? ?Patient Location: Home ?  ?Provider Location: [place] office ?  ?Reason for Visit: Follow-up GERD ?  ?Persons present on the virtual encounter, with roles: Aliene Altes, PA-C (Provider), Martin Young (patient) ?  ?Total time (minutes) spent on medical discussion: 3 minutes ? ?Virtual Visit via video Note ?Due to COVID-19, visit is conducted virtually and was requested by patient.  ? ?I connected with Martin Young on 02/02/22 at 11:30 AM EDT by video and verified that I am speaking with the correct person using two identifiers. ?  ?I discussed the limitations, risks, security and privacy concerns of performing an evaluation and management service by video and the availability of in person appointments. I also discussed with the patient that there may be a patient responsible charge related to this service. The patient expressed understanding and agreed to proceed. ? ?Chief Complaint  ?Patient presents with  ? Follow-up  ? ? ? ?History of Present Illness: ?56 y.o. male  presenting today via virtual visit for follow-up of GERD. EGD and colonoscopy in file in 2019.  EGD with moderate of reflux esophagitis and he was started on Protonix 40 mg daily at that time.  Colonoscopy with tubular adenoma with recommendations to repeat colonoscopy in 5 years. ? ?Last seen in our office 12/24/2020.  GERD was well controlled on Protonix 40 mg daily.  No alarm symptoms.  Recommended continuing current medications and follow-up in 1 year. ? ?Today:  ?Doing well overall. GERD remains well controlled on Protonix 40 mg daily. Denies nausea, vomiting, abdominal pain, or dysphagia. Bowels are moving well without brbpr or melena.  ? ?Past Medical History:  ?Diagnosis Date  ? GERD (gastroesophageal reflux disease)   ? HLD (hyperlipidemia)   ? Hypertension   ? ? ? ?Past Surgical History:  ?Procedure Laterality Date  ? COLONOSCOPY N/A 08/29/2018  ? Surgeon: Daneil Dolin, MD;  Two 5 to 6 mm polyps removed, otherwise normal exam.  Pathology with benign colonic mucosal and 1 tubular adenoma.  Recommended repeat colonoscopy in 5 years.  ? ESOPHAGOGASTRODUODENOSCOPY N/A 08/29/2018  ? Surgeon: Daneil Dolin, MD;  Mild erosive reflux esophagitis, normal examined stomach and duodenum.  Recommended stopping omeprazole and starting Protonix 40 mg daily.  ? NERVE REPAIR Left   ? radial nerve  ? OLECRANON BURSECTOMY Right 07/17/2015  ? Procedure: OLECRANON BURSECTOMY;  Surgeon: Carole Civil, MD;  Location: [facility];  Service: Orthopedics;  Laterality: Right;  ? POLYPECTOMY  08/29/2018  ? Procedure: POLYPECTOMY;  Surgeon: Daneil Dolin, MD;  [facility];  Service: Endoscopy;;  (colon)  ? ? ? ?Current Meds  ?Medication Sig  ? atorvastatin (LIPITOR) 20 MG tablet Take 20 mg by mouth daily.  ? clotrimazole-betamethasone (LOTRISONE) cream Apply 1 application topically daily as needed (jock itch).  ? ibuprofen (ADVIL,MOTRIN) 800 MG tablet Take 800 mg by mouth as needed for moderate pain.  ? telmisartan-hydrochlorothiazide (MICARDIS HCT) 40-12.5 MG tablet Take 1 tablet by mouth daily.  ? [DISCONTINUED] pantoprazole (PROTONIX) 40 MG tablet TAKE 1 TABLET BY MOUTH EVERY DAY  ?  ? ?Family History  ?Problem Relation Age of Onset  ? Hypertension Mother   ? Diabetes Father   ?     steroid related  ? Colon cancer Maternal Uncle   ?     diagnosed in 77s  ? ? ?Social History  ? ?Socioeconomic History  ? Marital status:  Married  ?  Spouse name: Not on file  ? Number of children: Not on file  ? Years of education: Not on file  ? Highest education level: Not on file  ?Occupational History  ? Not on file  ?Tobacco Use  ? Smoking status: Never  ? Smokeless tobacco: Never  ?Vaping Use  ? Vaping Use: Never used  ?Substance and Sexual Activity  ? Alcohol use: Yes  ?  Alcohol/week: 6.0 standard drinks  ?  Types: 6 Cans of beer per week  ? Drug use: No  ? Sexual activity: Yes  ?  Birth  control/protection: None  ?Other Topics Concern  ? Not on file  ?Social History Narrative  ? Not on file  ? ?Social Determinants of Health  ? ?Financial Resource Strain: Not on file  ?Food Insecurity: Not on file  ?Transportation Needs: Not on file  ?Physical Activity: Not on file  ?Stress: Not on file  ?Social Connections: Not on file  ? ? ? ? ? Review of Systems: ?Gen: Denies fever, chills, cold or flu like symptoms, pre-syncope, or syncope.   ?GI: see HPI ?Heme: See HPI ? ?Observations/Objective: ?No distress. Alert and oriented. Pleasant. Well nourished. Normal mood and affect. Unable to perform complete physical exam due to video encounter.  ? ? ?Assessment:  ?56 year old male with history of HTN, HLD, GERD, reflux esophagitis, presenting today for follow-up after he milligrams daily.  No alarm symptoms.  We will continue him on his current regimen and plan to follow-up in 1 year. ? ?Plan: ?Continue Protonix 40 mg daily 30 minutes for progress.  1 year with refill sent to pharmacy. ?Reinforced GERD diet/lifestyle.  Separate written instructions provided. ?Follow-up in 1 year or sooner if needed. ? ? ?  ?I discussed the assessment and treatment plan with the patient. The patient was provided an opportunity to ask questions and all were answered. The patient agreed with the plan and demonstrated an understanding of the instructions. ?  ?The patient was advised to call back or seek an in-person evaluation if the symptoms worsen or if the condition fails to improve as anticipated. ? ?I provided 3 minutes of video-face-to-face time during this encounter. ? ?Aliene Altes, PA-C ?[facility]  ?02/02/2022  ?

## 2022-02-02 ENCOUNTER — Encounter: Payer: Self-pay | Admitting: Gastroenterology

## 2022-02-02 ENCOUNTER — Telehealth: Payer: Self-pay | Admitting: *Deleted

## 2022-02-02 ENCOUNTER — Telehealth (INDEPENDENT_AMBULATORY_CARE_PROVIDER_SITE_OTHER): Payer: 59 | Admitting: Gastroenterology

## 2022-02-02 VITALS — Ht 74.0 in | Wt 210.0 lb

## 2022-02-02 DIAGNOSIS — K219 Gastro-esophageal reflux disease without esophagitis: Secondary | ICD-10-CM

## 2022-02-02 MED ORDER — PANTOPRAZOLE SODIUM 40 MG PO TBEC: 40.0000 mg | DELAYED_RELEASE_TABLET | Freq: Every day | ORAL | 3 refills | Status: DC

## 2022-02-02 NOTE — Patient Instructions (Addendum)
Continue Protonix 40 mg daily 30 minutes before breakfast. ? ?Follow a GERD diet:  ?Avoid fried, fatty, greasy, spicy, citrus foods. ?Avoid caffeine and carbonated beverages. ?Avoid chocolate. ?Try eating 4-6 small meals a day rather than 3 large meals. ?Do not eat within 3 hours of laying down. ?Prop head of bed up on wood or bricks to create a 6 inch incline. ? ?We will see you back in 1 year.  Do not hesitate to call if you have any questions or concerns prior to your next visit. ? ?I am glad you are doing well overall!  It was great to see you again today! ? ?Aliene Altes, PA-C ?[facility] ? ? ? ?

## 2022-02-02 NOTE — Telephone Encounter (Signed)
Pt consented to a virtual visit. 

## 2022-02-02 NOTE — Telephone Encounter (Signed)
Suzy Bouchard, you are scheduled for a virtual visit with your provider today.  Just as we do with appointments in the office, we must obtain your consent to participate.  Your consent will be active for this visit and any virtual visit you may have with one of our providers in the next 365 days.  If you have a MyChart account, I can also send a copy of this consent to you electronically.  All virtual visits are billed to your insurance company just like a traditional visit in the office.  As this is a virtual visit, video technology does not allow for your provider to perform a traditional examination.  This may limit your provider's ability to fully assess your condition.  If your provider identifies any concerns that need to be evaluated in person or the need to arrange testing such as labs, EKG, etc, we will make arrangements to do so.  Although advances in technology are sophisticated, we cannot ensure that it will always work on either your end or our end.  If the connection with a video visit is poor, we may have to switch to a telephone visit.  With either a video or telephone visit, we are not always able to ensure that we have a secure connection.   I need to obtain your verbal consent now.   Are you willing to proceed with your visit today?  ?

## 2023-01-05 ENCOUNTER — Encounter: Payer: Self-pay | Admitting: Gastroenterology

## 2023-02-14 NOTE — Progress Notes (Unsigned)
Referring Provider: Mirna Mires, MD Primary Care Physician:  Mirna Mires, MD Primary GI Physician: Dr. Jena Gauss  No chief complaint on file.   HPI:   Martin Young is a 57 y.o. male presenting today for 1 year follow-up of GERD.  Also with history of tubular adenomas, due for surveillance colonoscopy later this year.  Last seen via virtual visit 02/02/2022.  GERD well-controlled on Protonix 40 mg daily.  No other significant GI symptoms or alarm symptoms.  Recommended continuing current medications and follow-up in 1 year.  EGD and colonoscopy in file in 2019.  EGD with moderate of reflux esophagitis and he was started on Protonix 40 mg daily at that time.  Colonoscopy with tubular adenoma with recommendations to repeat colonoscopy in 5 years.   Today:    Past Medical History:  Diagnosis Date   GERD (gastroesophageal reflux disease)    HLD (hyperlipidemia)    Hypertension     Past Surgical History:  Procedure Laterality Date   COLONOSCOPY N/A 08/29/2018   Surgeon: Corbin Ade, MD;  Two 5 to 6 mm polyps removed, otherwise normal exam.  Pathology with benign colonic mucosal and 1 tubular adenoma.  Recommended repeat colonoscopy in 5 years.   ESOPHAGOGASTRODUODENOSCOPY N/A 08/29/2018   Surgeon: Corbin Ade, MD;  Mild erosive reflux esophagitis, normal examined stomach and duodenum.  Recommended stopping omeprazole and starting Protonix 40 mg daily.   NERVE REPAIR Left    radial nerve   OLECRANON BURSECTOMY Right 07/17/2015   Procedure: OLECRANON BURSECTOMY;  Surgeon: Vickki Hearing, MD;  Location: [facility];  Service: Orthopedics;  Laterality: Right;   POLYPECTOMY  08/29/2018   Procedure: POLYPECTOMY;  Surgeon: Corbin Ade, MD;  [facility];  Service: Endoscopy;;  (colon)    Current Outpatient Medications  Medication Sig Dispense Refill   atorvastatin (LIPITOR) 20 MG tablet Take 20 mg by mouth daily.     clotrimazole-betamethasone (LOTRISONE) cream  Apply 1 application topically daily as needed (jock itch).     ibuprofen (ADVIL,MOTRIN) 800 MG tablet Take 800 mg by mouth as needed for moderate pain.     pantoprazole (PROTONIX) 40 MG tablet Take 1 tablet (40 mg total) by mouth daily. 90 tablet 3   tadalafil (CIALIS) 10 MG tablet Take 5 mg by mouth daily as needed for erectile dysfunction. (Patient not taking: Reported on 12/24/2020)  3   telmisartan-hydrochlorothiazide (MICARDIS HCT) 40-12.5 MG tablet Take 1 tablet by mouth daily.     No current facility-administered medications for this visit.    Allergies as of 02/16/2023   (No Known Allergies)    Family History  Problem Relation Age of Onset   Hypertension Mother    Diabetes Father        steroid related   Colon cancer Maternal Uncle        diagnosed in 68s    Social History   Socioeconomic History   Marital status: Married    Spouse name: Not on file   Number of children: Not on file   Years of education: Not on file   Highest education level: Not on file  Occupational History   Not on file  Tobacco Use   Smoking status: Never   Smokeless tobacco: Never  Vaping Use   Vaping Use: Never used  Substance and Sexual Activity   Alcohol use: Yes    Alcohol/week: 6.0 standard drinks of alcohol    Types: 6 Cans of beer per week  Drug use: No   Sexual activity: Yes    Birth control/protection: None  Other Topics Concern   Not on file  Social History Narrative   Not on file   Social Determinants of Health   Financial Resource Strain: Not on file  Food Insecurity: Not on file  Transportation Needs: Not on file  Physical Activity: Not on file  Stress: Not on file  Social Connections: Not on file    Review of Systems: Gen: Denies fever, chills, anorexia. Denies fatigue, weakness, weight loss.  CV: Denies chest pain, palpitations, syncope, peripheral edema, and claudication. Resp: Denies dyspnea at rest, cough, wheezing, coughing up blood, and pleurisy. GI:  Denies vomiting blood, jaundice, and fecal incontinence.   Denies dysphagia or odynophagia. Derm: Denies rash, itching, dry skin Psych: Denies depression, anxiety, memory loss, confusion. No homicidal or suicidal ideation.  Heme: Denies bruising, bleeding, and enlarged lymph nodes.  Physical Exam: There were no vitals taken for this visit. General:   Alert and oriented. No distress noted. Pleasant and cooperative.  Head:  Normocephalic and atraumatic. Eyes:  Conjuctiva clear without scleral icterus. Heart:  S1, S2 present without murmurs appreciated. Lungs:  Clear to auscultation bilaterally. No wheezes, rales, or rhonchi. No distress.  Abdomen:  +BS, soft, non-tender and non-distended. No rebound or guarding. No HSM or masses noted. Msk:  Symmetrical without gross deformities. Normal posture. Extremities:  Without edema. Neurologic:  Alert and  oriented x4 Psych:  Normal mood and affect.    Assessment:     Plan:  ***   Ermalinda Memos, PA-C [facility] 02/16/2023

## 2023-02-16 ENCOUNTER — Ambulatory Visit (INDEPENDENT_AMBULATORY_CARE_PROVIDER_SITE_OTHER): Payer: 59 | Admitting: Gastroenterology

## 2023-02-16 ENCOUNTER — Encounter: Payer: Self-pay | Admitting: Gastroenterology

## 2023-02-16 VITALS — BP 127/82 | HR 86 | Temp 98.1°F | Ht 74.0 in | Wt 216.4 lb

## 2023-02-16 DIAGNOSIS — Z86010 Personal history of colonic polyps: Secondary | ICD-10-CM | POA: Diagnosis not present

## 2023-02-16 DIAGNOSIS — K219 Gastro-esophageal reflux disease without esophagitis: Secondary | ICD-10-CM | POA: Diagnosis not present

## 2023-02-16 MED ORDER — PANTOPRAZOLE SODIUM 40 MG PO TBEC: 40.0000 mg | DELAYED_RELEASE_TABLET | Freq: Every day | ORAL | 3 refills | Status: DC

## 2023-02-16 NOTE — Patient Instructions (Signed)
Continue taking pantoprazole 40 mg daily.  You will be due for a colonoscopy around November/December of this year.  You should receive a letter from [place] in the mail regarding this.  We will plan to see you back in 1 year for routine follow-up.  Do not hesitate to call sooner if you have concerns.  It was great to see you again today!  Enjoy your trip to [place]!  Ermalinda Memos, PA-C [facility]

## 2023-08-18 ENCOUNTER — Encounter: Payer: Self-pay | Admitting: *Deleted

## 2023-09-05 ENCOUNTER — Telehealth: Payer: Self-pay | Admitting: *Deleted

## 2023-09-05 DIAGNOSIS — Z860101 Personal history of adenomatous and serrated colon polyps: Secondary | ICD-10-CM

## 2023-09-05 NOTE — Telephone Encounter (Signed)
  Procedure: Colonoscopy  Height: 6'2 Weight: 206lbs        Have you had a colonoscopy before?  08/29/18, Dr. Jena Gauss  Do you have family history of colon cancer?  Yes, uncle  Do you have a family history of polyps? no  Previous colonoscopy with polyps removed? no  Do you have a history colorectal cancer?   no  Are you diabetic?  no  Do you have a prosthetic or mechanical heart valve? no  Do you have a pacemaker/defibrillator?   no  Have you had endocarditis/atrial fibrillation?  no  Do you use supplemental oxygen/CPAP?  no  Have you had joint replacement within the last 12 months?  no  Do you tend to be constipated or have to use laxatives?  no   Do you have history of alcohol use? If yes, how much and how often.  Yes couple drinks a week  Do you have history or are you using drugs? If yes, what do are you  using?  no  Have you ever had a stroke/heart attack?  no  Have you ever had a heart or other vascular stent placed,?no  Do you take weight loss medication? no  Do you take any blood-thinning medications such as: (Plavix, aspirin, Coumadin, Aggrenox, Brilinta, Xarelto, Eliquis, Pradaxa, Savaysa or Effient)? no  If yes we need the name, milligram, dosage and who is prescribing doctor:               Current Outpatient Medications  Medication Sig Dispense Refill   atorvastatin (LIPITOR) 20 MG tablet Take 20 mg by mouth daily.     clotrimazole-betamethasone (LOTRISONE) cream Apply 1 application topically daily as needed (jock itch).     ibuprofen (ADVIL,MOTRIN) 800 MG tablet Take 800 mg by mouth as needed for moderate pain.     pantoprazole (PROTONIX) 40 MG tablet Take 1 tablet (40 mg total) by mouth daily. 90 tablet 3   telmisartan-hydrochlorothiazide (MICARDIS HCT) 40-12.5 MG tablet Take 1 tablet by mouth daily.     No current facility-administered medications for this visit.    No Known Allergies

## 2023-10-26 NOTE — Telephone Encounter (Signed)
lmovm

## 2023-10-30 MED ORDER — NA SULFATE-K SULFATE-MG SULF 17.5-3.13-1.6 GM/177ML PO SOLN: 1.0000 | ORAL | 0 refills | Status: DC

## 2023-10-30 NOTE — Telephone Encounter (Signed)
Patient returned call. He has been scheduled for 2/20 at 10:30am. Aware will send instructions to him in the mail. Rx for prep sent to pharmacy. Lab orders placed for [facility].   Per [facility] "Notification/Precertification Requirement This member's plan does not currently require notification or prior-authorization through the [employer] Notification or Prior-Authorization Program. Please contact a Customer Care Professional at [phone] if you believe the information returned to be in error."

## 2023-10-30 NOTE — Addendum Note (Signed)
Addended by: Armstead Peaks on: 10/30/2023 02:33 PM   Modules accepted: Orders

## 2023-10-31 NOTE — Telephone Encounter (Signed)
Questionnaire from recall, no referral needed

## 2023-11-18 LAB — BASIC METABOLIC PANEL
BUN/Creatinine Ratio: 12 (ref 9–20)
BUN: 14 mg/dL (ref 6–24)
CO2: 23 mmol/L (ref 20–29)
Calcium: 9.3 mg/dL (ref 8.7–10.2)
Chloride: 104 mmol/L (ref 96–106)
Creatinine, Ser: 1.17 mg/dL (ref 0.76–1.27)
Glucose: 119 mg/dL — ABNORMAL HIGH (ref 70–99)
Potassium: 4.2 mmol/L (ref 3.5–5.2)
Sodium: 145 mmol/L — ABNORMAL HIGH (ref 134–144)
eGFR: 74 mL/min/{1.73_m2} (ref >59)

## 2023-11-23 ENCOUNTER — Ambulatory Visit (HOSPITAL_COMMUNITY): Payer: 59 | Admitting: Anesthesiology

## 2023-11-23 ENCOUNTER — Other Ambulatory Visit: Payer: Self-pay

## 2023-11-23 ENCOUNTER — Ambulatory Visit (HOSPITAL_COMMUNITY)
Admission: RE | Admit: 2023-11-23 | Discharge: 2023-11-23 | Disposition: A | Discharge status: Home / Self Care | Payer: 59 | Attending: Internal Medicine | Admitting: Internal Medicine

## 2023-11-23 ENCOUNTER — Ambulatory Visit (HOSPITAL_BASED_OUTPATIENT_CLINIC_OR_DEPARTMENT_OTHER): Payer: 59 | Admitting: Anesthesiology

## 2023-11-23 ENCOUNTER — Encounter (HOSPITAL_COMMUNITY)
Admission: RE | Disposition: A | Discharge status: Home / Self Care | Payer: Self-pay | Source: Home / Self Care | Attending: Internal Medicine

## 2023-11-23 ENCOUNTER — Encounter (HOSPITAL_COMMUNITY): Payer: Self-pay | Admitting: Internal Medicine

## 2023-11-23 DIAGNOSIS — Z860100 Personal history of colon polyps, unspecified: Secondary | ICD-10-CM | POA: Diagnosis not present

## 2023-11-23 DIAGNOSIS — Z8249 Family history of ischemic heart disease and other diseases of the circulatory system: Secondary | ICD-10-CM | POA: Diagnosis not present

## 2023-11-23 DIAGNOSIS — Z800 Family history of malignant neoplasm of digestive organs: Secondary | ICD-10-CM | POA: Insufficient documentation

## 2023-11-23 DIAGNOSIS — I10 Essential (primary) hypertension: Secondary | ICD-10-CM | POA: Insufficient documentation

## 2023-11-23 DIAGNOSIS — K219 Gastro-esophageal reflux disease without esophagitis: Secondary | ICD-10-CM | POA: Insufficient documentation

## 2023-11-23 DIAGNOSIS — Z1211 Encounter for screening for malignant neoplasm of colon: Secondary | ICD-10-CM | POA: Diagnosis not present

## 2023-11-23 DIAGNOSIS — Z860101 Personal history of adenomatous and serrated colon polyps: Secondary | ICD-10-CM

## 2023-11-23 HISTORY — PX: COLONOSCOPY WITH PROPOFOL: SHX5780

## 2023-11-23 SURGERY — COLONOSCOPY WITH PROPOFOL
Anesthesia: General

## 2023-11-23 MED ORDER — PROPOFOL 500 MG/50ML IV EMUL
INTRAVENOUS | Status: DC | PRN
  Administered 2023-11-23: 150 ug/kg/min via INTRAVENOUS

## 2023-11-23 MED ORDER — PROPOFOL 10 MG/ML IV BOLUS
INTRAVENOUS | Status: DC | PRN
  Administered 2023-11-23: 100 mg via INTRAVENOUS

## 2023-11-23 MED ORDER — LIDOCAINE HCL (PF) 2 % IJ SOLN
INTRAMUSCULAR | Status: DC | PRN
  Administered 2023-11-23: 50 mg via INTRADERMAL

## 2023-11-23 MED ORDER — STERILE WATER FOR IRRIGATION IR SOLN
Status: DC | PRN
  Administered 2023-11-23: 50 mL

## 2023-11-23 MED ORDER — LACTATED RINGERS IV SOLN
INTRAVENOUS | Status: DC
  Administered 2023-11-23: 1000 mL via INTRAVENOUS

## 2023-11-23 NOTE — Op Note (Signed)
[facility] Patient Name: Martin Young Procedure Date: 11/23/2023 8:58 AM MRN: [id number] Date of Birth: October 22, 1965 Attending MD: Gennette Pac , MD, [id number] CSN: [id number] Age: 58 Admit Type: Outpatient Procedure:                Colonoscopy Indications:              High risk colon cancer surveillance: Personal                            history of colonic polyps Providers:                Gennette Pac, MD, Buel Ream. Museum/gallery exhibitions officer, Charity fundraiser,                            Judeth Cornfield. Jessee Avers, Technician Referring MD:              Medicines:                Propofol per Anesthesia Complications:            No immediate complications. Estimated Blood Loss:     Estimated blood loss: none. Procedure:                Pre-Anesthesia Assessment:                           - Prior to the procedure, a History and Physical                            was performed, and patient medications and                            allergies were reviewed. The patient's tolerance of                            previous anesthesia was also reviewed. The risks                            and benefits of the procedure and the sedation                            options and risks were discussed with the patient.                            All questions were answered, and informed consent                            was obtained. Prior Anticoagulants: The patient has                            taken no anticoagulant or antiplatelet agents. ASA                            Grade Assessment: II - A patient with mild systemic  disease. After reviewing the risks and benefits,                            the patient was deemed in satisfactory condition to                            undergo the procedure.                           After obtaining informed consent, the colonoscope                            was passed under direct vision. Throughout the                             procedure, the patient's blood pressure, pulse, and                            oxygen saturations were monitored continuously. The                            [id number] scope was introduced through the                            anus and advanced to the the cecum, identified by                            appendiceal orifice and ileocecal valve. The                            colonoscopy was performed without difficulty. The                            patient tolerated the procedure well. The quality                            of the bowel preparation was adequate. The                            ileocecal valve, appendiceal orifice, and rectum                            were photographed. Scope In: 9:41:16 AM Scope Out: 9:56:54 AM Scope Withdrawal Time: 0 hours 11 minutes 19 seconds  Total Procedure Duration: 0 hours 15 minutes 38 seconds  Findings:      The perianal and digital rectal examinations were normal.      The colon (entire examined portion) appeared normal.      The retroflexed view of the distal rectum and anal verge was normal and       showed no anal or rectal abnormalities. Estimated blood loss: none. Impression:               - The entire examined colon is normal.                           -  The distal rectum and anal verge are normal on                            retroflexion view.                           - No specimens collected. Moderate Sedation:      Moderate (conscious) sedation was personally administered by an       anesthesia professional. The following parameters were monitored: oxygen       saturation, heart rate, blood pressure, respiratory rate, EKG, adequacy       of pulmonary ventilation, and response to care. Recommendation:           - Patient has a contact number available for                            emergencies. The signs and symptoms of potential                            delayed complications were discussed with the                             patient. Return to normal activities tomorrow.                            Written discharge instructions were provided to the                            patient.                           - Advance diet as tolerated.                           - Repeat colonoscopy in 7 years for surveillance.                           - Return to GI office (date not yet determined). Procedure Code(s):        --- Professional ---                           [phone], Colonoscopy, flexible; diagnostic, including                            collection of specimen(s) by brushing or washing,                            when performed (separate procedure) Diagnosis Code(s):        --- Professional ---                           Z86.010, Personal history of colonic polyps CPT copyright 2022 [employer]. All rights reserved. The codes documented in this report are preliminary and upon coder review may  be revised to meet current compliance requirements. Gerrit Friends. Jena Gauss, MD Gennette Pac, MD 11/23/2023 10:06:27 AM  This report has been signed electronically. Number of Addenda: 0

## 2023-11-23 NOTE — Anesthesia Preprocedure Evaluation (Signed)
 Anesthesia Evaluation  Patient identified by MRN, date of birth, ID band Patient awake    Reviewed: Allergy & Precautions, H&P , NPO status , Patient's Chart, lab work & pertinent test results, reviewed documented beta blocker date and time   Airway Mallampati: II  TM Distance: >3 FB Neck ROM: full    Dental no notable dental hx.    Pulmonary neg pulmonary ROS   Pulmonary exam normal breath sounds clear to auscultation       Cardiovascular Exercise Tolerance: Good hypertension, negative cardio ROS  Rhythm:regular Rate:Normal     Neuro/Psych negative neurological ROS  negative psych ROS   GI/Hepatic negative GI ROS, Neg liver ROS,GERD  ,,  Endo/Other  negative endocrine ROS    Renal/GU negative Renal ROS  negative genitourinary   Musculoskeletal   Abdominal   Peds  Hematology negative hematology ROS (+)   Anesthesia Other Findings   Reproductive/Obstetrics negative OB ROS                             Anesthesia Physical Anesthesia Plan  ASA: 2  Anesthesia Plan: General   Post-op Pain Management:    Induction:   PONV Risk Score and Plan: Propofol infusion  Airway Management Planned:   Additional Equipment:   Intra-op Plan:   Post-operative Plan:   Informed Consent: I have reviewed the patients History and Physical, chart, labs and discussed the procedure including the risks, benefits and alternatives for the proposed anesthesia with the patient or authorized representative who has indicated his/her understanding and acceptance.     Dental Advisory Given  Plan Discussed with: CRNA  Anesthesia Plan Comments:        Anesthesia Quick Evaluation

## 2023-11-23 NOTE — Discharge Instructions (Addendum)
  Colonoscopy Discharge Instructions  Read the instructions outlined below and refer to this sheet in the next few weeks. These discharge instructions provide you with general information on caring for yourself after you leave the hospital. Your doctor may also give you specific instructions. While your treatment has been planned according to the most current medical practices available, unavoidable complications occasionally occur. If you have any problems or questions after discharge, call Dr. Jena Gauss at [phone]. ACTIVITY You may resume your regular activity, but move at a slower pace for the next 24 hours.  Take frequent rest periods for the next 24 hours.  Walking will help get rid of the air and reduce the bloated feeling in your belly (abdomen).  No driving for 24 hours (because of the medicine (anesthesia) used during the test).   Do not sign any important legal documents or operate any machinery for 24 hours (because of the anesthesia used during the test).  NUTRITION Drink plenty of fluids.  You may resume your normal diet as instructed by your doctor.  Begin with a light meal and progress to your normal diet. Heavy or fried foods are harder to digest and may make you feel sick to your stomach (nauseated).  Avoid alcoholic beverages for 24 hours or as instructed.  MEDICATIONS You may resume your normal medications unless your doctor tells you otherwise.  WHAT YOU CAN EXPECT TODAY Some feelings of bloating in the abdomen.  Passage of more gas than usual.  Spotting of blood in your stool or on the toilet paper.  IF YOU HAD POLYPS REMOVED DURING THE COLONOSCOPY: No aspirin products for 7 days or as instructed.  No alcohol for 7 days or as instructed.  Eat a soft diet for the next 24 hours.  FINDING OUT THE RESULTS OF YOUR TEST Not all test results are available during your visit. If your test results are not back during the visit, make an appointment with your caregiver to find out the  results. Do not assume everything is normal if you have not heard from your caregiver or the medical facility. It is important for you to follow up on all of your test results.  SEEK IMMEDIATE MEDICAL ATTENTION IF: You have more than a spotting of blood in your stool.  Your belly is swollen (abdominal distention).  You are nauseated or vomiting.  You have a temperature over 101.  You have abdominal pain or discomfort that is severe or gets worse throughout the day.       Your prep was excellent!  And your colon appeared normal today!      It is recommended you return for a repeat colonoscopy in 7 years

## 2023-11-23 NOTE — Transfer of Care (Signed)
Immediate Anesthesia Transfer of Care Note  Patient: Martin Young  Procedure(s) Performed: COLONOSCOPY WITH PROPOFOL  Patient Location: Endoscopy Unit  Anesthesia Type:General  Level of Consciousness: awake  Airway & Oxygen Therapy: Patient Spontanous Breathing  Post-op Assessment: Report given to RN and Post -op Vital signs reviewed and stable  Post vital signs: Reviewed and stable  Last Vitals:  Vitals Value Taken Time  BP    Temp    Pulse    Resp    SpO2      Last Pain:  Vitals:   11/23/23 0938  TempSrc:   PainSc: 0-No pain      Patients Stated Pain Goal: 8 (11/23/23 0900)  Complications: No notable events documented.

## 2023-11-23 NOTE — Anesthesia Procedure Notes (Signed)
Date/Time: 11/23/2023 9:37 AM  Performed by: Julian Reil, CRNAPre-anesthesia Checklist: Patient identified, Emergency Drugs available, Suction available and Patient being monitored Patient Re-evaluated:Patient Re-evaluated prior to induction Oxygen Delivery Method: Nasal cannula Induction Type: IV induction Placement Confirmation: positive ETCO2

## 2023-11-23 NOTE — H&P (Signed)
@LOGO @   Primary Care Physician:  Mirna Mires, MD Primary Gastroenterologist:  Dr. Jena Gauss  Pre-Procedure History & Physical: HPI:  Martin Young is a 58 y.o. male here for  surveillance colonoscopy for history of colonic polyps removed 2019 at least 1 was an adenoma.  Past Medical History:  Diagnosis Date   GERD (gastroesophageal reflux disease)    HLD (hyperlipidemia)    Hypertension     Past Surgical History:  Procedure Laterality Date   COLONOSCOPY N/A 08/29/2018   Surgeon: Corbin Ade, MD;  Two 5 to 6 mm polyps removed, otherwise normal exam.  Pathology with benign colonic mucosal and 1 tubular adenoma.  Recommended repeat colonoscopy in 5 years.   ESOPHAGOGASTRODUODENOSCOPY N/A 08/29/2018   Surgeon: Corbin Ade, MD;  Mild erosive reflux esophagitis, normal examined stomach and duodenum.  Recommended stopping omeprazole and starting Protonix 40 mg daily.   NERVE REPAIR Left    radial nerve   OLECRANON BURSECTOMY Right 07/17/2015   Procedure: OLECRANON BURSECTOMY;  Surgeon: Vickki Hearing, MD;  Location: [facility];  Service: Orthopedics;  Laterality: Right;   POLYPECTOMY  08/29/2018   Procedure: POLYPECTOMY;  Surgeon: Corbin Ade, MD;  Location: [facility];  Service: Endoscopy;;  (colon)    Prior to Admission medications   Medication Sig Start Date End Date Taking? Authorizing Provider  atorvastatin (LIPITOR) 20 MG tablet Take 20 mg by mouth daily.   Yes [provider]  ibuprofen (ADVIL,MOTRIN) 800 MG tablet Take 800 mg by mouth as needed for moderate pain.   Yes [provider]  pantoprazole (PROTONIX) 40 MG tablet Take 1 tablet (40 mg total) by mouth daily. 02/16/23  Yes Letta Median, PA-C  telmisartan-hydrochlorothiazide (MICARDIS HCT) 40-12.5 MG tablet Take 1 tablet by mouth daily.   Yes [provider]  clotrimazole-betamethasone (LOTRISONE) cream Apply 1 application topically daily as needed (jock itch).    [provider]  Na Sulfate-K Sulfate-Mg Sulfate concentrate 17.5-3.13-1.6 GM/177ML SOLN Take 1 kit by mouth as directed. 10/30/23   Corbin Ade, MD    Allergies as of 10/30/2023   (No Known Allergies)    Family History  Problem Relation Age of Onset   Hypertension Mother    Diabetes Father        steroid related   Colon cancer Maternal Uncle        diagnosed in 103s    Social History   Socioeconomic History   Marital status: Married    Spouse name: Not on file   Number of children: Not on file   Years of education: Not on file   Highest education level: Not on file  Occupational History   Not on file  Tobacco Use   Smoking status: Never   Smokeless tobacco: Never  Vaping Use   Vaping status: Never Used  Substance and Sexual Activity   Alcohol use: Yes    Alcohol/week: 6.0 standard drinks of alcohol    Types: 6 Cans of beer per week    Comment: 1 beer a day or less   Drug use: No   Sexual activity: Yes    Birth control/protection: None  Other Topics Concern   Not on file  Social History Narrative   Not on file   Social Drivers of Health   Financial Resource Strain: Not on file  Food Insecurity: Not on file  Transportation Needs: Not on file  Physical Activity: Not on file  Stress: Not on file  Social Connections: Not on file  Intimate Partner Violence: Not on file    Review of Systems: See HPI, otherwise negative ROS  Physical Exam: BP (!) 151/96   Pulse 93   Temp 98.7 F (37.1 C) (Oral)   Resp 17   Ht 6\' 1"  (1.854 m)   Wt 94.3 kg   SpO2 98%   BMI 27.44 kg/m  General:   Alert,  Well-developed, well-nourished, pleasant and cooperative in NAD Neck:  Supple; no masses or thyromegaly. No significant cervical adenopathy. Lungs:  Clear throughout to auscultation.   No wheezes, crackles, or rhonchi. No acute distress. Heart:  Regular rate and rhythm; no murmurs, clicks, rubs,  or gallops. Abdomen: Non-distended, normal bowel sounds.  Soft and  nontender without appreciable mass or hepatosplenomegaly.   Impression/Plan:    58 year old gentleman history of colonic adenoma here for surveillance colonoscopy.  The risks, benefits, limitations, alternatives and imponderables have been reviewed with the patient. Questions have been answered. All parties are agreeable.       Notice: This dictation was prepared with Dragon dictation along with smaller phrase technology. Any transcriptional errors that result from this process are unintentional and may not be corrected upon review.

## 2023-11-24 ENCOUNTER — Encounter (HOSPITAL_COMMUNITY): Payer: Self-pay | Admitting: Internal Medicine

## 2023-11-25 NOTE — Anesthesia Postprocedure Evaluation (Signed)
 Anesthesia Post Note  Patient: LONN IM  Procedure(s) Performed: COLONOSCOPY WITH PROPOFOL  Patient location during evaluation: Phase II Anesthesia Type: General Level of consciousness: awake Pain management: pain level controlled Vital Signs Assessment: post-procedure vital signs reviewed and stable Respiratory status: spontaneous breathing and respiratory function stable Cardiovascular status: blood pressure returned to baseline and stable Postop Assessment: no headache and no apparent nausea or vomiting Anesthetic complications: no Comments: Late entry   No notable events documented.   Last Vitals:  Vitals:   11/23/23 0900 11/23/23 1000  BP: (!) 151/96 133/71  Pulse: 93 95  Resp: 17 17  Temp: 37.1 C 36.8 C  SpO2: 98% 97%    Last Pain:  Vitals:   11/23/23 1000  TempSrc: Oral  PainSc: 0-No pain                 Windell Norfolk

## 2023-12-04 ENCOUNTER — Ambulatory Visit: Payer: 59 | Admitting: Orthopedic Surgery

## 2023-12-04 ENCOUNTER — Encounter: Payer: Self-pay | Admitting: Orthopedic Surgery

## 2023-12-04 VITALS — BP 126/78 | HR 94 | Ht 73.0 in | Wt 217.0 lb

## 2023-12-04 DIAGNOSIS — M7702 Medial epicondylitis, left elbow: Secondary | ICD-10-CM

## 2023-12-04 NOTE — Progress Notes (Signed)
  Subjective:     Patient ID: Martin Young, male   DOB: [birth year], 58 y.o.   MRN: [id number]  This is a 58 year old male who started having pain in his left elbow on the medial side about 3 weeks ago.  He associates this with an increased workout routine  He took some Flexeril and ibuprofen stopped working out and his symptoms are improving.  He almost canceled this appointment     Review of Systems  Neurological: Negative.        Objective:   Physical Exam Vitals and nursing note reviewed. Exam conducted with a chaperone present.  Constitutional:      General: He is not in acute distress.    Appearance: Normal appearance. He is normal weight. He is not ill-appearing, toxic-appearing or diaphoretic.  HENT:     Head: Normocephalic and atraumatic.  Eyes:     General: No scleral icterus.       Right eye: No discharge.        Left eye: No discharge.     Extraocular Movements: Extraocular movements intact.     Pupils: Pupils are equal, round, and reactive to light.  Cardiovascular:     Rate and Rhythm: Normal rate.     Pulses: Normal pulses.  Pulmonary:     Effort: Pulmonary effort is normal.  Musculoskeletal:     Comments: Left elbow Skin normal no rash Mild tenderness 1 fingerbreadth distal to the medial epicondyle Full range of motion No instability Normal strength Normal sensation ulnar nerve Negative Tinel's  Skin:    General: Skin is warm and dry.     Capillary Refill: Capillary refill takes less than 2 seconds.  Neurological:     General: No focal deficit present.     Mental Status: He is alert and oriented to person, place, and time.     Sensory: No sensory deficit.     Gait: Gait normal.  Psychiatric:        Mood and Affect: Mood normal.        Assessment:     Encounter Diagnosis  Name Primary?   Medial epicondylitis of elbow, left Yes       Plan:     Continue rest, ibuprofen, should resolve

## 2023-12-04 NOTE — Progress Notes (Signed)
  Intake history:  There were no vitals taken for this visit. There is no height or weight on file to calculate BMI.    WHAT ARE WE SEEING YOU FOR TODAY?   left elbow  How long has this bothered you? (DOI?DOS?WS?)  3 week(s) ago  Anticoag.  No  Diabetes No  Heart disease No  Hypertension Yes  SMOKING HX No  Kidney disease No  Any ALLERGIES ______________________________________________   Treatment:  Have you taken:  Tylenol No  Advil Yes  Had PT No  Had injection No  Other  _________________________

## 2024-01-10 ENCOUNTER — Encounter: Payer: Self-pay | Admitting: Gastroenterology

## 2024-02-15 ENCOUNTER — Other Ambulatory Visit: Payer: Self-pay | Admitting: Gastroenterology

## 2024-02-15 DIAGNOSIS — K219 Gastro-esophageal reflux disease without esophagitis: Secondary | ICD-10-CM

## 2024-03-19 NOTE — Progress Notes (Signed)
 Referring Provider: Wilburn Handler, MD Primary Care Physician:  Wilburn Handler, MD Primary GI Physician: Dr. Riley Cheadle  No chief complaint on file.   HPI:   Martin Young is a 57 y.o. male presenting today for 1 year follow-up of GERD.     Also with history of adenomatous colon polyps. Recently underwent surveillance colonoscopy 11/23/23 and found to have entirely normal exam. Recommended 7 year surveillance.   Past Medical History:  Diagnosis Date   GERD (gastroesophageal reflux disease)    HLD (hyperlipidemia)    Hypertension     Past Surgical History:  Procedure Laterality Date   COLONOSCOPY N/A 08/29/2018   Surgeon: Suzette Espy, MD;  Two 5 to 6 mm polyps removed, otherwise normal exam.  Pathology with benign colonic mucosal and 1 tubular adenoma.  Recommended repeat colonoscopy in 5 years.   COLONOSCOPY WITH PROPOFOL  N/A 11/23/2023   Procedure: COLONOSCOPY WITH PROPOFOL ;  Surgeon: Suzette Espy, MD;  Location: [facility];  Service: Endoscopy;  Laterality: N/A;  1030am, asa 2   ESOPHAGOGASTRODUODENOSCOPY N/A 08/29/2018   Surgeon: Suzette Espy, MD;  Mild erosive reflux esophagitis, normal examined stomach and duodenum.  Recommended stopping omeprazole and starting Protonix  40 mg daily.   NERVE REPAIR Left    radial nerve   OLECRANON BURSECTOMY Right 07/17/2015   Procedure: OLECRANON BURSECTOMY;  Surgeon: Darrin Emerald, MD;  Location: [facility];  Service: Orthopedics;  Laterality: Right;   POLYPECTOMY  08/29/2018   Procedure: POLYPECTOMY;  Surgeon: Suzette Espy, MD;  Location: [facility];  Service: Endoscopy;;  (colon)    Current Outpatient Medications  Medication Sig Dispense Refill   atorvastatin (LIPITOR) 20 MG tablet Take 20 mg by mouth daily.     clotrimazole-betamethasone (LOTRISONE) cream Apply 1 application topically daily as needed (jock itch).     ibuprofen (ADVIL,MOTRIN) 800 MG tablet Take 800 mg by mouth as needed for moderate pain.     Na  Sulfate-K Sulfate-Mg Sulfate concentrate 17.5-3.13-1.6 GM/177ML SOLN Take 1 kit by mouth as directed. 354 mL 0   pantoprazole  (PROTONIX ) 40 MG tablet TAKE 1 TABLET BY MOUTH EVERY DAY 90 tablet 3   telmisartan-hydrochlorothiazide (MICARDIS HCT) 40-12.5 MG tablet Take 1 tablet by mouth daily.     No current facility-administered medications for this visit.    Allergies as of 03/21/2024   (No Known Allergies)    Family History  Problem Relation Age of Onset   Hypertension Mother    Diabetes Father        steroid related   Colon cancer Maternal Uncle        diagnosed in 68s    Social History   Socioeconomic History   Marital status: Married    Spouse name: Not on file   Number of children: Not on file   Years of education: Not on file   Highest education level: Not on file  Occupational History   Not on file  Tobacco Use   Smoking status: Never   Smokeless tobacco: Never  Vaping Use   Vaping status: Never Used  Substance and Sexual Activity   Alcohol use: Yes    Alcohol/week: 6.0 standard drinks of alcohol    Types: 6 Cans of beer per week    Comment: 1 beer a day or less   Drug use: No   Sexual activity: Yes    Birth control/protection: None  Other Topics Concern   Not on file  Social History Narrative  Not on file   Social Drivers of Health   Financial Resource Strain: Not on file  Food Insecurity: Not on file  Transportation Needs: Not on file  Physical Activity: Not on file  Stress: Not on file  Social Connections: Not on file    Review of Systems: Gen: Denies fever, chills, anorexia. Denies fatigue, weakness, weight loss.  CV: Denies chest pain, palpitations, syncope, peripheral edema, and claudication. Resp: Denies dyspnea at rest, cough, wheezing, coughing up blood, and pleurisy. GI: Denies vomiting blood, jaundice, and fecal incontinence.   Denies dysphagia or odynophagia. Derm: Denies rash, itching, dry skin Psych: Denies depression, anxiety,  memory loss, confusion. No homicidal or suicidal ideation.  Heme: Denies bruising, bleeding, and enlarged lymph nodes.  Physical Exam: There were no vitals taken for this visit. General:   Alert and oriented. No distress noted. Pleasant and cooperative.  Head:  Normocephalic and atraumatic. Eyes:  Conjuctiva clear without scleral icterus. Heart:  S1, S2 present without murmurs appreciated. Lungs:  Clear to auscultation bilaterally. No wheezes, rales, or rhonchi. No distress.  Abdomen:  +BS, soft, non-tender and non-distended. No rebound or guarding. No HSM or masses noted. Msk:  Symmetrical without gross deformities. Normal posture. Extremities:  Without edema. Neurologic:  Alert and  oriented x4 Psych:  Normal mood and affect.    Assessment:     Plan:  ***   Shana Daring, PA-C [facility] 03/21/2024

## 2024-03-21 ENCOUNTER — Ambulatory Visit (INDEPENDENT_AMBULATORY_CARE_PROVIDER_SITE_OTHER): Admitting: Gastroenterology

## 2024-03-21 ENCOUNTER — Encounter: Payer: Self-pay | Admitting: Gastroenterology

## 2024-03-21 ENCOUNTER — Telehealth: Payer: Self-pay | Admitting: Gastroenterology

## 2024-03-21 VITALS — BP 129/79 | HR 83 | Temp 97.7°F | Ht 74.0 in | Wt 215.8 lb

## 2024-03-21 DIAGNOSIS — K219 Gastro-esophageal reflux disease without esophagitis: Secondary | ICD-10-CM

## 2024-03-21 DIAGNOSIS — Z860101 Personal history of adenomatous and serrated colon polyps: Secondary | ICD-10-CM | POA: Diagnosis not present

## 2024-03-21 NOTE — Telephone Encounter (Signed)
 Please place patient on recall for surveillance colonoscopy in February 2032.

## 2024-03-21 NOTE — Patient Instructions (Signed)
 Continue Pantoprazole  40 mg daily.   Next colonoscopy will be due in 2032.  I will see you back in 1 year or sooner if needed.   Shana Daring, PA-C [facility]
